# Patient Record
Sex: Female | Born: 1975
Health system: Southern US, Community
[De-identification: ages and names within clinical notes are randomized; demographics above are authoritative.]

## PROBLEM LIST (undated history)

## (undated) ENCOUNTER — Inpatient Hospital Stay (HOSPITAL_COMMUNITY): Payer: Self-pay

## (undated) DIAGNOSIS — F329 Major depressive disorder, single episode, unspecified: Secondary | ICD-10-CM

## (undated) DIAGNOSIS — F32A Depression, unspecified: Secondary | ICD-10-CM

## (undated) DIAGNOSIS — O09529 Supervision of elderly multigravida, unspecified trimester: Secondary | ICD-10-CM

## (undated) DIAGNOSIS — O24419 Gestational diabetes mellitus in pregnancy, unspecified control: Secondary | ICD-10-CM

## (undated) DIAGNOSIS — B019 Varicella without complication: Secondary | ICD-10-CM

## (undated) HISTORY — PX: NO PAST SURGERIES: SHX2092

---

## 2005-12-14 ENCOUNTER — Encounter: Admission: RE | Admit: 2005-12-14 | Discharge: 2005-12-14 | Payer: Self-pay | Admitting: Specialist

## 2010-12-15 ENCOUNTER — Emergency Department (HOSPITAL_BASED_OUTPATIENT_CLINIC_OR_DEPARTMENT_OTHER)
Admission: EM | Admit: 2010-12-15 | Discharge: 2010-12-15 | Disposition: A | Discharge status: Home / Self Care | Payer: Self-pay | Attending: Emergency Medicine | Admitting: Emergency Medicine

## 2010-12-15 ENCOUNTER — Emergency Department (INDEPENDENT_AMBULATORY_CARE_PROVIDER_SITE_OTHER): Payer: Self-pay

## 2010-12-15 DIAGNOSIS — R0602 Shortness of breath: Secondary | ICD-10-CM

## 2010-12-15 DIAGNOSIS — R0989 Other specified symptoms and signs involving the circulatory and respiratory systems: Secondary | ICD-10-CM | POA: Insufficient documentation

## 2010-12-15 DIAGNOSIS — R0609 Other forms of dyspnea: Secondary | ICD-10-CM | POA: Insufficient documentation

## 2010-12-15 LAB — POCT CARDIAC MARKERS

## 2010-12-15 LAB — COMPREHENSIVE METABOLIC PANEL
AST: 40 U/L — ABNORMAL HIGH (ref 0–37)
Albumin: 3.8 g/dL (ref 3.5–5.2)
BUN: 8 mg/dL (ref 6–23)
CO2: 26 mEq/L (ref 19–32)
Calcium: 8.8 mg/dL (ref 8.4–10.5)
Potassium: 4.6 mEq/L (ref 3.5–5.1)
Total Protein: 7.2 g/dL (ref 6.0–8.3)

## 2010-12-15 MED ORDER — IOHEXOL 350 MG/ML SOLN: 80.0000 mL | Freq: Once | INTRAVENOUS | Status: DC | PRN

## 2011-10-01 ENCOUNTER — Encounter (HOSPITAL_BASED_OUTPATIENT_CLINIC_OR_DEPARTMENT_OTHER): Payer: Self-pay | Admitting: *Deleted

## 2011-10-01 ENCOUNTER — Emergency Department (HOSPITAL_BASED_OUTPATIENT_CLINIC_OR_DEPARTMENT_OTHER)
Admission: EM | Admit: 2011-10-01 | Discharge: 2011-10-01 | Disposition: A | Discharge status: Home / Self Care | Payer: Medicaid Other | Attending: Emergency Medicine | Admitting: Emergency Medicine

## 2011-10-01 DIAGNOSIS — R52 Pain, unspecified: Secondary | ICD-10-CM

## 2011-10-01 DIAGNOSIS — O209 Hemorrhage in early pregnancy, unspecified: Secondary | ICD-10-CM

## 2011-10-01 DIAGNOSIS — O26859 Spotting complicating pregnancy, unspecified trimester: Secondary | ICD-10-CM | POA: Insufficient documentation

## 2011-10-01 DIAGNOSIS — R1013 Epigastric pain: Secondary | ICD-10-CM | POA: Insufficient documentation

## 2011-10-01 LAB — URINE MICROSCOPIC-ADD ON

## 2011-10-01 LAB — CBC
HCT: 35.3 % — ABNORMAL LOW (ref 36.0–46.0)
Hemoglobin: 12.1 g/dL (ref 12.0–15.0)
RBC: 4.05 MIL/uL (ref 3.87–5.11)
WBC: 10.4 10*3/uL (ref 4.0–10.5)

## 2011-10-01 LAB — URINALYSIS, ROUTINE W REFLEX MICROSCOPIC
Glucose, UA: NEGATIVE mg/dL
Nitrite: NEGATIVE
Protein, ur: NEGATIVE mg/dL
Urobilinogen, UA: 0.2 mg/dL (ref 0.0–1.0)

## 2011-10-01 LAB — PREGNANCY, URINE: Preg Test, Ur: POSITIVE — AB

## 2011-10-01 LAB — ABO/RH: ABO/RH(D): O POS

## 2011-10-01 LAB — BASIC METABOLIC PANEL: GFR calc Af Amer: >90 mL/min (ref >90)

## 2011-10-01 NOTE — ED Provider Notes (Signed)
Medical screening examination/treatment/procedure(s) were conducted as a shared visit with non-physician practitioner(s) and myself.  I personally evaluated the patient during the encounter   Dione Booze, MD 10/01/11 2356

## 2011-10-01 NOTE — ED Notes (Signed)
Patient denies pain and is resting comfortably.  

## 2011-10-01 NOTE — ED Notes (Signed)
[redacted] weeks pregnant. Abdominal cramps and small amt of vaginal bleeding last night and tonight.

## 2011-10-01 NOTE — ED Provider Notes (Signed)
History     CSN: [id number]  Arrival date & time 10/01/11  1911   First MD Initiated Contact with Patient 10/01/11 2047      Chief Complaint  Patient presents with  . Vaginal Bleeding    (Consider location/radiation/quality/duration/timing/severity/associated sxs/prior treatment) HPI Comments: Pt states that she thinks that she is [redacted] weeks pregnant:pt states her lmp was 12/10:pt states that [facility] and had a positive pregnancy:pt states that this is her 3rd pregnancy and she has 2 live children:pt states that she had a little bit of bleeding last night and again tonight:pt states that it is not even enough that she needs a pad or tampon:pt states that she is having some suprapubic cramping  Patient is a 36 y.o. female presenting with vaginal bleeding. The history is provided by the patient. No language interpreter was used.  Vaginal Bleeding This is a new problem. The current episode started yesterday. The problem occurs intermittently. The problem has been unchanged. Pertinent negatives include no coughing, fever, vomiting or weakness. The symptoms are aggravated by nothing. She has tried nothing for the symptoms.    History reviewed. No pertinent past medical history.  History reviewed. No pertinent past surgical history.  No family history on file.  History  Substance Use Topics  . Smoking status: Never Smoker   . Smokeless tobacco: Not on file  . Alcohol Use: No    OB History    Grav Para Term Preterm Abortions TAB SAB Ect Mult Living                  Review of Systems  Constitutional: Negative for fever.  Respiratory: Negative for cough.   Gastrointestinal: Negative for vomiting.  Genitourinary: Positive for vaginal bleeding.  Neurological: Negative for weakness.  All other systems reviewed and are negative.    Allergies  Review of patient's allergies indicates no known allergies.  Home Medications   Current Outpatient  Rx  Name Route Sig Dispense Refill  . CETIRIZINE HCL 10 MG PO TABS Oral Take 10 mg by mouth daily.    Marland Kitchen PRENATAL PLUS 27-1 MG PO TABS Oral Take 1 tablet by mouth daily.      BP 114/63  Pulse 81  Temp(Src) 98.7 F (37.1 C) (Oral)  Resp 20  SpO2 100%  LMP 08/10/2011  Physical Exam  Nursing note and vitals reviewed. Constitutional: She is oriented to person, place, and time. She appears well-developed and well-nourished.  HENT:  Head: Normocephalic and atraumatic.  Eyes: EOM are normal.  Neck: Neck supple.  Cardiovascular: Normal rate and regular rhythm.   Pulmonary/Chest: Effort normal and breath sounds normal.  Abdominal: Soft. Bowel sounds are normal. There is tenderness in the suprapubic area.  Genitourinary: Right adnexum displays no tenderness. Left adnexum displays no tenderness.    Musculoskeletal: Normal range of motion.  Neurological: She is alert and oriented to person, place, and time.  Skin: Skin is warm and dry.  Psychiatric: She has a normal mood and affect.    ED Course  Procedures (including critical care time)  Labs Reviewed  PREGNANCY, URINE - Abnormal; Notable for the following:    Preg Test, Ur POSITIVE (*)    All other components within normal limits  CBC - Abnormal; Notable for the following:    HCT 35.3 (*)    All other components within normal limits  BASIC METABOLIC PANEL  URINALYSIS, ROUTINE W REFLEX MICROSCOPIC  ABO/RH  HCG, QUANTITATIVE, PREGNANCY  No results found.   1. Bleeding in early pregnancy       MDM  Discussed with pt that possibility of miscarriage:pt is to come back tomorrow to have and ultrasound:pt vitals are stable        Teressa Lower, NP 10/01/11 2159

## 2011-10-01 NOTE — ED Provider Notes (Signed)
36 year old female is approximately [redacted] weeks pregnant had onset last night of vaginal spotting and suprapubic cramping. On exam, she is only mildly to moderately tender in the suprapubic area and in the midline. She will need to have a pelvic ultrasound set up as an outpatient tomorrow.  Dione Booze, MD 10/01/11 2145

## 2011-10-01 NOTE — ED Notes (Signed)
Pelvic cart at bedside of pt.

## 2011-10-01 NOTE — ED Notes (Signed)
Pt. Aware she is to return in the morning for [place]

## 2011-10-02 ENCOUNTER — Other Ambulatory Visit (HOSPITAL_BASED_OUTPATIENT_CLINIC_OR_DEPARTMENT_OTHER): Payer: Self-pay | Admitting: Nurse Practitioner

## 2011-10-02 ENCOUNTER — Ambulatory Visit (INDEPENDENT_AMBULATORY_CARE_PROVIDER_SITE_OTHER)
Admission: RE | Admit: 2011-10-02 | Discharge: 2011-10-02 | Disposition: A | Discharge status: Home / Self Care | Payer: Medicaid Other | Source: Ambulatory Visit | Attending: Nurse Practitioner | Admitting: Nurse Practitioner

## 2011-10-02 ENCOUNTER — Ambulatory Visit (HOSPITAL_BASED_OUTPATIENT_CLINIC_OR_DEPARTMENT_OTHER)
Admission: RE | Admit: 2011-10-02 | Discharge: 2011-10-02 | Disposition: A | Discharge status: Home / Self Care | Payer: Medicaid Other | Source: Ambulatory Visit | Attending: Nurse Practitioner | Admitting: Nurse Practitioner

## 2011-10-02 DIAGNOSIS — Z331 Pregnant state, incidental: Secondary | ICD-10-CM

## 2011-10-02 DIAGNOSIS — R52 Pain, unspecified: Secondary | ICD-10-CM

## 2011-10-02 DIAGNOSIS — N898 Other specified noninflammatory disorders of vagina: Secondary | ICD-10-CM

## 2011-10-02 DIAGNOSIS — R109 Unspecified abdominal pain: Secondary | ICD-10-CM | POA: Insufficient documentation

## 2011-10-02 DIAGNOSIS — R58 Hemorrhage, not elsewhere classified: Secondary | ICD-10-CM

## 2011-10-02 DIAGNOSIS — N949 Unspecified condition associated with female genital organs and menstrual cycle: Secondary | ICD-10-CM

## 2011-10-02 DIAGNOSIS — O99891 Other specified diseases and conditions complicating pregnancy: Secondary | ICD-10-CM | POA: Insufficient documentation

## 2011-11-12 ENCOUNTER — Other Ambulatory Visit: Payer: Self-pay | Admitting: Obstetrics and Gynecology

## 2011-11-12 ENCOUNTER — Other Ambulatory Visit (HOSPITAL_COMMUNITY)
Admission: RE | Admit: 2011-11-12 | Discharge: 2011-11-12 | Disposition: A | Discharge status: Home / Self Care | Payer: Medicaid Other | Source: Ambulatory Visit | Attending: Obstetrics and Gynecology | Admitting: Obstetrics and Gynecology

## 2011-11-12 DIAGNOSIS — Z113 Encounter for screening for infections with a predominantly sexual mode of transmission: Secondary | ICD-10-CM | POA: Insufficient documentation

## 2011-11-12 DIAGNOSIS — R87810 Cervical high risk human papillomavirus (HPV) DNA test positive: Secondary | ICD-10-CM | POA: Insufficient documentation

## 2011-11-12 DIAGNOSIS — Z124 Encounter for screening for malignant neoplasm of cervix: Secondary | ICD-10-CM | POA: Insufficient documentation

## 2011-11-17 ENCOUNTER — Encounter: Payer: Self-pay | Admitting: Obstetrics and Gynecology

## 2011-11-17 LAB — US OB COMP + 14 WK

## 2011-12-08 ENCOUNTER — Encounter (HOSPITAL_COMMUNITY): Payer: Self-pay | Admitting: Obstetrics and Gynecology

## 2011-12-08 ENCOUNTER — Other Ambulatory Visit: Payer: Self-pay | Admitting: Obstetrics and Gynecology

## 2011-12-08 DIAGNOSIS — O09529 Supervision of elderly multigravida, unspecified trimester: Secondary | ICD-10-CM

## 2011-12-16 ENCOUNTER — Encounter (HOSPITAL_COMMUNITY): Payer: Self-pay

## 2011-12-16 ENCOUNTER — Ambulatory Visit (HOSPITAL_COMMUNITY)
Admission: RE | Admit: 2011-12-16 | Discharge: 2011-12-16 | Disposition: A | Discharge status: Home / Self Care | Payer: Medicaid Other | Source: Ambulatory Visit | Attending: Obstetrics and Gynecology | Admitting: Obstetrics and Gynecology

## 2011-12-16 DIAGNOSIS — O09529 Supervision of elderly multigravida, unspecified trimester: Secondary | ICD-10-CM | POA: Insufficient documentation

## 2011-12-16 DIAGNOSIS — Z1389 Encounter for screening for other disorder: Secondary | ICD-10-CM | POA: Insufficient documentation

## 2011-12-16 DIAGNOSIS — O358XX0 Maternal care for other (suspected) fetal abnormality and damage, not applicable or unspecified: Secondary | ICD-10-CM | POA: Insufficient documentation

## 2011-12-16 DIAGNOSIS — Z363 Encounter for antenatal screening for malformations: Secondary | ICD-10-CM | POA: Insufficient documentation

## 2011-12-16 HISTORY — DX: Major depressive disorder, single episode, unspecified: F32.9

## 2011-12-16 HISTORY — DX: Varicella without complication: B01.9

## 2011-12-16 HISTORY — DX: Depression, unspecified: F32.A

## 2011-12-16 NOTE — Progress Notes (Signed)
Genetic Counseling  High-Risk Gestation Note  Appointment Date:  12/16/2011 Referred By: Fortino Sic, MD Date of Birth:  1976-05-08    Pregnancy History: [id number] Estimated Date of Delivery: 05/16/12 Estimated Gestational Age: [redacted]w[redacted]d Attending: Particia Nearing, MD   Stacy Cole was seen for genetic counseling because of a maternal age of 36 y.o..     She was counseled regarding maternal age and the association with risk for chromosome conditions due to nondisjunction with aging of the ova.   We reviewed chromosomes, nondisjunction, and the associated 1 in 15 risk for fetal aneuploidy related to a maternal age of 36 y.o. at [redacted]w[redacted]d gestation.  She was counseled that the risk for aneuploidy decreases as gestational age increases, accounting for those pregnancies which spontaneously abort.  We specifically discussed Down syndrome (trisomy 67), trisomies 15 and 58, and sex chromosome aneuploidies (47,XXX and 47,XXY) including the common features and prognoses of each.   We reviewed available screening and diagnostic options.  Regarding screening tests, we discussed the options of Quad screen and ultrasound.  She understands that screening tests are used to modify a patient's a priori risk for aneuploidy, typically based on age.  This estimate provides a pregnancy specific risk assessment. We discussed another type of screening test, noninvasive prenatal testing (NIPT), which utilizes cell free fetal DNA found in the maternal circulation. This test is not diagnostic for chromosome conditions, but can provide information regarding the presence or absence of extra fetal DNA for chromosomes 13, 18 and 21. Thus, it would not identify or rule out all fetal aneuploidy. The reported detection rate is greater than 99% for Trisomy 21, greater than 97% for Trisomy 18, and is approximately 80% (8 out of 10) for Trisomy 13. The false positive rate is reported to be less than 1% for any of these conditions. We  also reviewed the availability of diagnostic option amniocentesis.  We discussed the risks, limitations, and benefits of each.  After reviewing these options, Stacy Cole elected to have ultrasound only, but declined Quad screen, cell free fetal DNA testing, and amniocentesis. She understands that ultrasound cannot rule out all birth defects or genetic syndromes. The patient was advised of this limitation and states she still does not want diagnostic testing at this time.  However, she was counseled that 50-80% of fetuses with Down syndrome and up to 90% of fetuses with trisomies 13 and 18, when well visualized, have detectable anomalies or soft markers by ultrasound. Complete detailed ultrasound was performed at the time of today's visit. Ultrasound results reported separately.   Stacy Cole was provided with written information regarding sickle cell anemia (SCA) including the carrier frequency and incidence in the African-American population, the availability of carrier testing and prenatal diagnosis if indicated.  In addition, we discussed that hemoglobinopathies are routinely screened for as part of the [place] newborn screening panel.  She elected to proceed with hemoglobin electrophoresis today.  Both family histories were reviewed and found to be noncontributory for birth defects, mental retardation, and known genetic conditions. Without further information regarding the provided family history, an accurate genetic risk cannot be calculated. Further genetic counseling is warranted if more information is obtained.  [name] denied exposure to environmental toxins or chemical agents. She denied the use of alcohol, tobacco or street drugs. She denied significant viral illnesses during the course of her pregnancy. Her medical and surgical histories were noncontributory.   I counseled Stacy Cole regarding the above risks and  available options.  The approximate face-to-face time with the  genetic counselor was 40 minutes.  Quinn Plowman, MS,  Certified Genetic Counselor 12/16/2011

## 2011-12-18 LAB — HEMOGLOBINOPATHY EVALUATION
Hgb A2 Quant: 2.7 % (ref 2.2–3.2)
Hgb A: 97.3 % (ref 96.8–97.8)
Hgb S Quant: 0 %

## 2011-12-21 ENCOUNTER — Telehealth (HOSPITAL_COMMUNITY): Payer: Self-pay | Admitting: MS"

## 2011-12-21 NOTE — Telephone Encounter (Signed)
Left message for patient to return call.

## 2011-12-22 NOTE — Telephone Encounter (Signed)
Called Ms. Leolia Heffington to discuss hemoglobin electrophoresis indicated the presence of normal hemoglobin (Hb AA). Thus, she does not appear to have sickle cell trait or other hemoglobin variant. Patient was happy to hear this information. All questions were answered to her satisfaction.   Quinn Plowman, MS Certified Genetic Counselor 12/22/2011  10:52 AM

## 2011-12-24 LAB — OB RESULTS CONSOLE ANTIBODY SCREEN: Antibody Screen: NEGATIVE

## 2011-12-24 LAB — OB RESULTS CONSOLE HEPATITIS B SURFACE ANTIGEN: Hepatitis B Surface Ag: NEGATIVE

## 2011-12-24 LAB — OB RESULTS CONSOLE ABO/RH

## 2011-12-24 LAB — OB RESULTS CONSOLE GC/CHLAMYDIA: Gonorrhea: NEGATIVE

## 2011-12-24 LAB — OB RESULTS CONSOLE RUBELLA ANTIBODY, IGM: Rubella: IMMUNE

## 2011-12-24 LAB — OB RESULTS CONSOLE HIV ANTIBODY (ROUTINE TESTING): HIV: NONREACTIVE

## 2012-02-16 ENCOUNTER — Other Ambulatory Visit: Payer: Self-pay

## 2012-03-10 ENCOUNTER — Encounter: Payer: Medicaid Other | Attending: Obstetrics & Gynecology | Admitting: Dietician

## 2012-03-10 ENCOUNTER — Ambulatory Visit (HOSPITAL_COMMUNITY)
Admission: RE | Admit: 2012-03-10 | Discharge: 2012-03-10 | Disposition: A | Discharge status: Home / Self Care | Payer: Medicaid Other | Source: Ambulatory Visit | Attending: Obstetrics & Gynecology | Admitting: Obstetrics & Gynecology

## 2012-03-10 DIAGNOSIS — Z713 Dietary counseling and surveillance: Secondary | ICD-10-CM | POA: Insufficient documentation

## 2012-03-10 DIAGNOSIS — O99810 Abnormal glucose complicating pregnancy: Secondary | ICD-10-CM | POA: Insufficient documentation

## 2012-03-10 NOTE — ED Notes (Signed)
03/10/2012 Diabetes Education:  G2 P3 lady currently at 30 weeks with an EDD of 05/16/2012.  Reports no problems other than GDM.  Three OGGT: Fasting 106, 1 Hr=209 2 hr= 198 3 hr= 198.  MD has prescribed a meter and she is monitoring fasting and 1 hr post meal blood glucose levels.  Fastings= 86-111. 1 hr post Breakfast=102-176  2 hr post lunch=68-110 1hr post dinner= 106,157.  She has been started on Glyburide 2.5 mg 2 tablets before breakfast and 2 tablets at bedtime.  Today, we reviewed the Carb restricted diet for GDM and Carbohydrate counting.  Provided handouts:Nutrition, Diabetes, and Pregnancy, and the Carb Counting Guide.  She has my card and is to call or e-mail with questions.  Stacy Angelyne Terwilliger, RN, RD, CDE

## 2012-03-17 ENCOUNTER — Other Ambulatory Visit: Payer: Self-pay

## 2012-04-01 ENCOUNTER — Ambulatory Visit (HOSPITAL_COMMUNITY)
Admission: RE | Admit: 2012-04-01 | Discharge: 2012-04-01 | Disposition: A | Discharge status: Home / Self Care | Payer: Medicaid Other | Source: Ambulatory Visit | Attending: Obstetrics & Gynecology | Admitting: Obstetrics & Gynecology

## 2012-04-01 ENCOUNTER — Encounter (HOSPITAL_COMMUNITY): Payer: Self-pay

## 2012-04-01 VITALS — BP 118/69 | HR 108 | Wt 218.8 lb

## 2012-04-01 DIAGNOSIS — O09529 Supervision of elderly multigravida, unspecified trimester: Secondary | ICD-10-CM

## 2012-04-01 HISTORY — DX: Gestational diabetes mellitus in pregnancy, unspecified control: O24.419

## 2012-04-01 HISTORY — DX: Supervision of elderly multigravida, unspecified trimester: O09.529

## 2012-04-11 ENCOUNTER — Other Ambulatory Visit: Payer: Self-pay | Admitting: Obstetrics and Gynecology

## 2012-04-11 ENCOUNTER — Other Ambulatory Visit (HOSPITAL_COMMUNITY)
Admission: RE | Admit: 2012-04-11 | Discharge: 2012-04-11 | Disposition: A | Discharge status: Home / Self Care | Payer: Medicaid Other | Source: Ambulatory Visit | Attending: Obstetrics and Gynecology | Admitting: Obstetrics and Gynecology

## 2012-04-11 DIAGNOSIS — Z124 Encounter for screening for malignant neoplasm of cervix: Secondary | ICD-10-CM | POA: Insufficient documentation

## 2012-04-14 NOTE — Progress Notes (Signed)
MFM Note  [name] is a 36 year old G3P2 Haitian at 33+4 weeks who presents for consultation regarding the management of her gestational diabetes. [name] had a one hour glucola of 178 follow by at 3 hour OGTT and the results were 106,209,198 and 198. Her Hgb A1c was 7.1. She received diabetic teaching and began testing her blood sugar at home. After about one week, she was started on glyburide and the dose has been increased slowly to 7.5 mgs in AM and 5.0 mgs in PM with good results. Reviewing her last ~ 2 weeks of blood sugars, only a very few were above the desired targets. She denies any other prenatal complications.  OB history: G1: 1999; term vaginal delivery in [place]; no complications G2: 2005; term vaginal delivery in [place], [place]; no complications G3: current pregnancy Medical history: none other than GDM Surgical history: none Allergies: NKDA Medications: PNV, iron and glyburide (as above) Social history: works at home; no tob/ETOH/drugs Family history: no congenital defects and no inheritable disorders  She understands the goals of therapy include fasting blood sugars less than 90 mg/dl and 1-hour postprandial less than 140 mg/dl with avoidance of hypoglycemia. I feel that right now her sugars are well controlled and you can continue to increase the glyburide if needed. If the maximum daily dose of glyburide is reached (10 mg bid) and glycemic control remains inadequate, the patient should be stared on insulin.    We reviewed that the goal of good glycemic control is to prevent macrosomia and the subsequent complications such as birth trauma, increased need for C/S and metabolic disturbances in the neonate.  Assessment: 1) IUP at 33+4 weeks 2) GDM requiring medication 3) AMA  Recommendations: 1) Continue to increase glyburide to 10 mgs every 12 hours 2) Needs twice weekly NSTs with weekly AFIs/BPPs 3) Growth [place] if not one in third trimester 4) Delivery by 39  weeks  Please call us if you have maxed out the glyburide and we can help with the initiation of insulin. Thank you for the kind referral.  (Face-to-face consultation with patient: 30 min)

## 2012-04-22 ENCOUNTER — Encounter (HOSPITAL_COMMUNITY): Payer: Self-pay | Admitting: *Deleted

## 2012-04-22 ENCOUNTER — Telehealth (HOSPITAL_COMMUNITY): Payer: Self-pay | Admitting: *Deleted

## 2012-04-22 NOTE — Telephone Encounter (Signed)
Preadmission screen  

## 2012-04-26 ENCOUNTER — Observation Stay (HOSPITAL_COMMUNITY)
Admission: RE | Admit: 2012-04-26 | Discharge: 2012-04-26 | Disposition: A | Discharge status: Home / Self Care | Payer: Medicaid Other | Source: Ambulatory Visit | Attending: Obstetrics & Gynecology | Admitting: Obstetrics & Gynecology

## 2012-04-26 ENCOUNTER — Encounter (HOSPITAL_COMMUNITY): Payer: Self-pay

## 2012-04-26 DIAGNOSIS — O321XX0 Maternal care for breech presentation, not applicable or unspecified: Secondary | ICD-10-CM | POA: Insufficient documentation

## 2012-04-26 DIAGNOSIS — O09529 Supervision of elderly multigravida, unspecified trimester: Secondary | ICD-10-CM | POA: Insufficient documentation

## 2012-04-26 DIAGNOSIS — O99810 Abnormal glucose complicating pregnancy: Principal | ICD-10-CM | POA: Insufficient documentation

## 2012-05-04 NOTE — H&P (Signed)
  Pt presented for external cephalic version secondary to breech presentation, but the baby had flipped to vertex spontaneously and the procedure had no indication.   A bedside US was done to confirm this and the patient recalls the baby moving to vertex the previous night

## 2012-05-06 ENCOUNTER — Telehealth (HOSPITAL_COMMUNITY): Payer: Self-pay | Admitting: *Deleted

## 2012-05-06 NOTE — Telephone Encounter (Signed)
Preadmission screen  

## 2012-05-08 ENCOUNTER — Encounter (HOSPITAL_COMMUNITY): Payer: Self-pay | Admitting: Anesthesiology

## 2012-05-08 ENCOUNTER — Encounter (HOSPITAL_COMMUNITY): Payer: Self-pay

## 2012-05-08 ENCOUNTER — Inpatient Hospital Stay (HOSPITAL_COMMUNITY)
Admission: RE | Admit: 2012-05-08 | Discharge: 2012-05-10 | DRG: 774 | Disposition: A | Discharge status: Home / Self Care | Payer: Medicaid Other | Source: Ambulatory Visit | Attending: Obstetrics & Gynecology | Admitting: Obstetrics & Gynecology

## 2012-05-08 ENCOUNTER — Inpatient Hospital Stay (HOSPITAL_COMMUNITY): Payer: Medicaid Other | Admitting: Anesthesiology

## 2012-05-08 VITALS — BP 112/76 | HR 94 | Temp 98.0°F | Resp 18 | Ht 67.0 in | Wt 222.0 lb

## 2012-05-08 DIAGNOSIS — O368190 Decreased fetal movements, unspecified trimester, not applicable or unspecified: Secondary | ICD-10-CM | POA: Diagnosis present

## 2012-05-08 DIAGNOSIS — O99814 Abnormal glucose complicating childbirth: Secondary | ICD-10-CM | POA: Diagnosis present

## 2012-05-08 DIAGNOSIS — O09529 Supervision of elderly multigravida, unspecified trimester: Secondary | ICD-10-CM | POA: Diagnosis present

## 2012-05-08 DIAGNOSIS — O4690 Antepartum hemorrhage, unspecified, unspecified trimester: Secondary | ICD-10-CM | POA: Diagnosis present

## 2012-05-08 DIAGNOSIS — O3660X0 Maternal care for excessive fetal growth, unspecified trimester, not applicable or unspecified: Principal | ICD-10-CM | POA: Diagnosis present

## 2012-05-08 LAB — COMPREHENSIVE METABOLIC PANEL
ALT: 7 U/L (ref 0–35)
AST: 23 U/L (ref 0–37)
Alkaline Phosphatase: 199 U/L — ABNORMAL HIGH (ref 39–117)
CO2: 21 mEq/L (ref 19–32)
Calcium: 8.9 mg/dL (ref 8.4–10.5)
GFR calc Af Amer: >90 mL/min (ref >90)
GFR calc non Af Amer: >90 mL/min (ref >90)
Glucose, Bld: 92 mg/dL (ref 70–99)
Potassium: 3.8 mEq/L (ref 3.5–5.1)
Sodium: 134 mEq/L — ABNORMAL LOW (ref 135–145)
Total Protein: 5.8 g/dL — ABNORMAL LOW (ref 6.0–8.3)

## 2012-05-08 LAB — RPR: RPR Ser Ql: NONREACTIVE

## 2012-05-08 LAB — CBC
HCT: 28.9 % — ABNORMAL LOW (ref 36.0–46.0)
Hemoglobin: 9.8 g/dL — ABNORMAL LOW (ref 12.0–15.0)
MCHC: 33.9 g/dL (ref 30.0–36.0)
WBC: 5.1 10*3/uL (ref 4.0–10.5)

## 2012-05-08 LAB — PREPARE RBC (CROSSMATCH)

## 2012-05-08 LAB — GLUCOSE, CAPILLARY: Glucose-Capillary: 72 mg/dL (ref 70–99)

## 2012-05-08 MED ORDER — OXYTOCIN 40 UNITS IN LACTATED RINGERS INFUSION - SIMPLE MED
1.0000 m[IU]/min | INTRAVENOUS | Status: DC
  Administered 2012-05-08: 2 m[IU]/min via INTRAVENOUS
  Filled 2012-05-08: qty 1000

## 2012-05-08 MED ORDER — PHENYLEPHRINE 40 MCG/ML (10ML) SYRINGE FOR IV PUSH (FOR BLOOD PRESSURE SUPPORT)
80.0000 ug | PREFILLED_SYRINGE | INTRAVENOUS | Status: DC | PRN
  Filled 2012-05-08: qty 5

## 2012-05-08 MED ORDER — OXYTOCIN 40 UNITS IN LACTATED RINGERS INFUSION - SIMPLE MED
62.5000 mL/h | Freq: Once | INTRAVENOUS | Status: AC
  Administered 2012-05-09: 62.5 mL/h via INTRAVENOUS

## 2012-05-08 MED ORDER — PENICILLIN G POTASSIUM 5000000 UNITS IJ SOLR
2.5000 10*6.[IU] | INTRAVENOUS | Status: DC
  Administered 2012-05-08 (×2): 2.5 10*6.[IU] via INTRAVENOUS
  Filled 2012-05-08 (×6): qty 2.5

## 2012-05-08 MED ORDER — LACTATED RINGERS IV SOLN
INTRAVENOUS | Status: DC
  Administered 2012-05-08: 20:00:00 via INTRAVENOUS
  Administered 2012-05-08: 125 mL/h via INTRAVENOUS

## 2012-05-08 MED ORDER — BUTORPHANOL TARTRATE 1 MG/ML IJ SOLN
1.0000 mg | INTRAMUSCULAR | Status: DC | PRN
  Administered 2012-05-08 (×2): 1 mg via INTRAVENOUS
  Filled 2012-05-08 (×2): qty 1

## 2012-05-08 MED ORDER — DEXTROSE 5 % IV SOLN
5.0000 10*6.[IU] | Freq: Once | INTRAVENOUS | Status: AC
  Administered 2012-05-08: 5 10*6.[IU] via INTRAVENOUS
  Filled 2012-05-08: qty 5

## 2012-05-08 MED ORDER — LIDOCAINE HCL (PF) 1 % IJ SOLN
30.0000 mL | INTRAMUSCULAR | Status: DC | PRN
  Filled 2012-05-08: qty 30

## 2012-05-08 MED ORDER — LIDOCAINE HCL (PF) 1 % IJ SOLN
INTRAMUSCULAR | Status: DC | PRN
  Administered 2012-05-08 (×4): 4 mL

## 2012-05-08 MED ORDER — IBUPROFEN 600 MG PO TABS
600.0000 mg | ORAL_TABLET | Freq: Four times a day (QID) | ORAL | Status: DC | PRN
  Administered 2012-05-09: 600 mg via ORAL
  Filled 2012-05-08: qty 1

## 2012-05-08 MED ORDER — FLEET ENEMA 7-19 GM/118ML RE ENEM: 1.0000 | ENEMA | RECTAL | Status: DC | PRN

## 2012-05-08 MED ORDER — FENTANYL 2.5 MCG/ML BUPIVACAINE 1/10 % EPIDURAL INFUSION (WH - ANES)
14.0000 mL/h | INTRAMUSCULAR | Status: DC
  Administered 2012-05-08 (×2): 14 mL/h via EPIDURAL
  Filled 2012-05-08 (×2): qty 60

## 2012-05-08 MED ORDER — PENICILLIN G POTASSIUM 5000000 UNITS IJ SOLR
2.5000 10*6.[IU] | INTRAVENOUS | Status: DC
  Filled 2012-05-08 (×3): qty 2.5

## 2012-05-08 MED ORDER — EPHEDRINE 5 MG/ML INJ: 10.0000 mg | INTRAVENOUS | Status: DC | PRN

## 2012-05-08 MED ORDER — DIPHENHYDRAMINE HCL 50 MG/ML IJ SOLN: 12.5000 mg | INTRAMUSCULAR | Status: DC | PRN

## 2012-05-08 MED ORDER — PHENYLEPHRINE 40 MCG/ML (10ML) SYRINGE FOR IV PUSH (FOR BLOOD PRESSURE SUPPORT): 80.0000 ug | PREFILLED_SYRINGE | INTRAVENOUS | Status: DC | PRN

## 2012-05-08 MED ORDER — ACETAMINOPHEN 325 MG PO TABS: 650.0000 mg | ORAL_TABLET | ORAL | Status: DC | PRN

## 2012-05-08 MED ORDER — HYDROXYZINE HCL 50 MG PO TABS: 50.0000 mg | ORAL_TABLET | Freq: Four times a day (QID) | ORAL | Status: DC | PRN

## 2012-05-08 MED ORDER — PENICILLIN G POTASSIUM 5000000 UNITS IJ SOLR
5.0000 10*6.[IU] | Freq: Once | INTRAVENOUS | Status: DC
  Filled 2012-05-08: qty 5

## 2012-05-08 MED ORDER — LACTATED RINGERS IV SOLN: 500.0000 mL | INTRAVENOUS | Status: DC | PRN

## 2012-05-08 MED ORDER — EPHEDRINE 5 MG/ML INJ
10.0000 mg | INTRAVENOUS | Status: DC | PRN
  Filled 2012-05-08: qty 4

## 2012-05-08 MED ORDER — HYDROXYZINE HCL 50 MG/ML IM SOLN: 50.0000 mg | Freq: Four times a day (QID) | INTRAMUSCULAR | Status: DC | PRN

## 2012-05-08 MED ORDER — MISOPROSTOL 25 MCG QUARTER TABLET
25.0000 ug | ORAL_TABLET | ORAL | Status: DC | PRN
  Administered 2012-05-08: 25 ug via VAGINAL
  Filled 2012-05-08: qty 0.25

## 2012-05-08 MED ORDER — OXYTOCIN BOLUS FROM INFUSION
500.0000 mL | Freq: Once | INTRAVENOUS | Status: DC
  Filled 2012-05-08: qty 500

## 2012-05-08 MED ORDER — LACTATED RINGERS IV SOLN: 500.0000 mL | Freq: Once | INTRAVENOUS | Status: DC

## 2012-05-08 MED ORDER — CITRIC ACID-SODIUM CITRATE 334-500 MG/5ML PO SOLN: 30.0000 mL | ORAL | Status: DC | PRN

## 2012-05-08 MED ORDER — ONDANSETRON HCL 4 MG/2ML IJ SOLN: 4.0000 mg | Freq: Four times a day (QID) | INTRAMUSCULAR | Status: DC | PRN

## 2012-05-08 MED ORDER — TERBUTALINE SULFATE 1 MG/ML IJ SOLN: 0.2500 mg | Freq: Once | INTRAMUSCULAR | Status: AC | PRN

## 2012-05-08 MED ORDER — OXYCODONE-ACETAMINOPHEN 5-325 MG PO TABS: 1.0000 | ORAL_TABLET | ORAL | Status: DC | PRN

## 2012-05-08 NOTE — H&P (Signed)
Stacy Cole is a 36 y.o. female presenting 38w6 today for cervical ripening prior to induction secondary to presumed macrosomia as the last [place] was 9#5 and AMA and decreased fetal movement and the patient thought she was supposed to be here at 7 am and not 7 pm and in light of the miscommunication will proceed with ripening secondary to only the few hour time difference.   The patient is aware of the possibiltiy of elective CSx but would like to not have this .  She is also aware of the possibility the need for CSx or the need for forceps or the need for vacuum and this is secondary to the clinical situation.  She and spouse are aware of the potential for bleeding, infection, nerve damage or cephalohematoma  The pregnancy has been complicated by poorly controlled DM which has been well controlled lately on the Glyburide 7.5 mg in the am and 5 mg in the pm and her current CBG is 86.  The patient has been monitored with serial antenatal testing and this is how the macrosomia was discovered.  The patient was admitted with 3rd trimester bleeding and this resolved.  Admitted at [facility].  The patient is AMA and was seen by MFM here at [facility] on outpatient consult basis with normal findings except:  Succenturiate lobe    Maternal Medical History:  Fetal activity: Perceived fetal activity is normal.   Last perceived fetal movement was within the past hour.    Prenatal complications: Bleeding and placental abnormality.   No cholelithiasis, HIV, hypertension, infection, IUGR, nephrolithiasis, oligohydramnios, pre-eclampsia or preterm labor.   Class B DM  Prenatal Complications - Diabetes: gestational. Diabetes is managed by oral agent (monotherapy).      OB History    Grav Para Term Preterm Abortions TAB SAB Ect Mult Living   3 2 2  0 0 0 0 0 0 2     Past Medical History  Diagnosis Date  . Depression   . Chicken pox   . AMA (advanced maternal age) multigravida 35+   .  Gestational diabetes     glyburide   History reviewed. No pertinent past surgical history. Family History: family history is not on file. Social History:  reports that she has never smoked. She has never used smokeless tobacco. She reports that she does not drink alcohol or use illicit drugs.   Prenatal Transfer Tool  Maternal Diabetes: Yes:  Diabetes Type:  Insulin/Medication controlled Genetic Screening: Normal Maternal Ultrasounds/Referrals: Normal Fetal Ultrasounds or other Referrals:  None Maternal Substance Abuse:  No Significant Maternal Medications:  Meds include: Other: glyburide 7.5 mg q am and 5 mg before dinner Significant Maternal Lab Results:  Lab values include: Group B Strep positive, Other:  HgbA1c last 6.4 on 04-29-12 Other Comments:  None  Review of Systems  Constitutional: Negative.  Negative for fever and chills.  HENT: Negative.   Eyes: Negative.   Respiratory: Negative.  Negative for shortness of breath.   Gastrointestinal: Negative.   Genitourinary: Negative.   Musculoskeletal: Negative.  Negative for joint pain.  Skin: Negative.  Negative for rash.  Neurological: Negative.  Negative for headaches.  Endo/Heme/Allergies: Negative.   Psychiatric/Behavioral: Negative.  Negative for depression and suicidal ideas.      Blood pressure 125/68, pulse 93, temperature 98.1 F (36.7 C), temperature source Oral, resp. rate 20, height 5\' 7"  (1.702 m), weight 100.699 kg (222 lb), last menstrual period 08/10/2011. Maternal Exam:  Uterine Assessment: Contraction strength  is mild.  Contraction frequency is irregular.   Abdomen: Fundal height is term.   Estimated fetal weight is 9#0.   Fetal presentation: vertex  Introitus: Normal vulva. Normal vagina.  Ferning test: not done.  Nitrazine test: not done. Amniotic fluid character: not assessed.  Pelvis: adequate for delivery.   Cervix: Cervix evaluated by digital exam.     Physical Exam  Constitutional: She is  oriented to person, place, and time. She appears well-developed and well-nourished.  HENT:  Head: Normocephalic and atraumatic.  Eyes: Conjunctivae are normal. Pupils are equal, round, and reactive to light. Right eye exhibits no discharge. Left eye exhibits no discharge.  Neck: Normal range of motion. Neck supple. No tracheal deviation present. No thyromegaly present.  Cardiovascular: Normal rate and regular rhythm.   Respiratory: Effort normal and breath sounds normal.  GI: Soft. She exhibits no distension. There is no tenderness. There is no rebound and no guarding.  Genitourinary: Vagina normal and uterus normal.  Musculoskeletal: Normal range of motion. She exhibits no edema and no tenderness.  Neurological: She is alert and oriented to person, place, and time. She has normal reflexes.  Skin: Skin is warm and dry. No rash noted.  Psychiatric: She has a normal mood and affect. Her behavior is normal. Judgment and thought content normal.    Prenatal labs: ABO, Rh: O/Positive/-- (04/25 0000) Antibody: Negative (04/25 0000) Rubella: Immune (04/25 0000) RPR: Nonreactive (04/25 0000)  HBsAg: Negative (04/25 0000)  HIV: Non-reactive (04/25 0000)  GBS:   positive  Assessment/Plan: IUP 38w6  Macrosomia Class A2 DM with improved control as the pregnancy progressed AMA with normal US findings Succenturiate lobe GBS positive   cytotec Stacy Cole H. 05/08/2012, 8:47 AM

## 2012-05-08 NOTE — Progress Notes (Signed)
Stacy Cole is a 36 y.o. G3P2002 at [redacted]w[redacted]d by LMP admitted for induction of labor due to class A2 DM and macrosomia here for cervical ripening and now 3/70/-2-soft/anterior.    Subjective:  Starting to feel contraction last CBG 76 and  Pain control planned to be breathing and natural as with her last two - options were discussed Objective: BP 140/86  Pulse 86  Temp 98.5 F (36.9 C) (Oral)  Resp 20  Ht 5\' 7"  (1.702 m)  Wt 100.699 kg (222 lb)  BMI 34.77 kg/m2  LMP 08/10/2011      FHT:  FHR: 140s bpm, variability: moderate,  accelerations:  Present,  decelerations:  Absent UC:   irregular, every 3-8 minutes SVE:   Dilation: 3 Effacement (%): 70 Station: -2 Exam by:: Dr Christell Constant Amniotomy performed with clear fluid noted Labs: Lab Results  Component Value Date   WBC 5.1 05/08/2012   HGB 9.8* 05/08/2012   HCT 28.9* 05/08/2012   MCV 90.9 05/08/2012   PLT 208 05/08/2012    Assessment / Plan: Induction of labor due to gestational diabetes and macrosomia and h/o 3rd trimester bleeding,  progressing well on pitocin  Labor: will monitor for now as cervix favorable Preeclampsia:  na Fetal Wellbeing:  Category I Pain Control:  Labor support without medications I/D:  GBS pos and will start ampicillin Anticipated MOD:  NSVD  Courtne Lighty H. 05/08/2012, 2:11 PM

## 2012-05-08 NOTE — Progress Notes (Signed)
Pt placed on beacon because of difficulty tracing fhr

## 2012-05-08 NOTE — Progress Notes (Signed)
Stacy Cole is a 35 y.o. G3P2002 at [redacted]w[redacted]d by LMP admitted for induction of labor due to Diabetes and macrosomia and AMA and 3 rd trimester bleeding.  Subjective:  Now comfortable with the CLE Objective: BP 123/91  Pulse 101  Temp 98.8 F (37.1 C) (Oral)  Resp 20  Ht 5\' 7"  (1.702 m)  Wt 100.699 kg (222 lb)  BMI 34.77 kg/m2  LMP 08/10/2011      FHT:  FHR: 140s  bpm, variability: moderate,  accelerations:  Present,  decelerations:  Present variables not repetitive UC:   irregular, every 2-4 minutes, regular, every 2-4 minutes pitocin at 10 mIu SVE:   Dilation: 4 Effacement (%): 60 Station: -3 Exam by:: [email or web address]-[email or web address],rnc 5/90/0 per my exam now no caput or molding  Labs: Lab Results  Component Value Date   WBC 5.1 05/08/2012   HGB 9.8* 05/08/2012   HCT 28.9* 05/08/2012   MCV 90.9 05/08/2012   PLT 208 05/08/2012    Assessment / Plan: Induction of labor due to gestational diabetes and macrosomia and AMA and 3rd trimester bleeding,  progressing well on pitocin  Labor: entering active phase hopefully  Preeclampsia:  na Fetal Wellbeing:  Category II Pain Control:  Epidural I/D:  gbs pos on pcn Anticipated MOD:  NSVD  Stacy Breshears H. 05/08/2012, 8:22 PM

## 2012-05-08 NOTE — Progress Notes (Signed)
Reviewed options for pain medication with pt.  IV and epidural.  Pt knows to let RN know if she wants pain medication

## 2012-05-08 NOTE — Anesthesia Procedure Notes (Signed)
Epidural Patient location during procedure: OB Start time: 05/08/2012 7:49 PM  Staffing Performed by: anesthesiologist   Preanesthetic Checklist Completed: patient identified, site marked, surgical consent, pre-op evaluation, timeout performed, IV checked, risks and benefits discussed and monitors and equipment checked  Epidural Patient position: sitting Prep: site prepped and draped and DuraPrep Patient monitoring: continuous pulse ox and blood pressure Approach: midline Injection technique: LOR air  Needle:  Needle type: Tuohy  Needle gauge: 17 G Needle length: 9 cm and 9 Needle insertion depth: 6 cm Catheter type: closed end flexible Catheter size: 19 Gauge Catheter at skin depth: 11 cm Test dose: negative  Assessment Events: blood not aspirated, injection not painful, no injection resistance, negative IV test and no paresthesia  Additional Notes Discussed risk of headache, infection, bleeding, nerve injury and failed or incomplete block.  Patient voices understanding and wishes to proceed. Reason for block:procedure for pain

## 2012-05-08 NOTE — Anesthesia Preprocedure Evaluation (Signed)
Anesthesia Evaluation  Patient identified by MRN, date of birth, ID band Patient awake    Reviewed: Allergy & Precautions, H&P , NPO status , Patient's Chart, lab work & pertinent test results, reviewed documented beta blocker date and time   History of Anesthesia Complications Negative for: history of anesthetic complications  Airway Mallampati: III TM Distance: >3 FB Neck ROM: full    Dental  (+) Teeth Intact   Pulmonary neg pulmonary ROS,  breath sounds clear to auscultation        Cardiovascular negative cardio ROS  Rhythm:regular Rate:Normal     Neuro/Psych negative neurological ROS  negative psych ROS   GI/Hepatic negative GI ROS, Neg liver ROS,   Endo/Other  diabetes, Gestational, Oral Hypoglycemic Agents  Renal/GU negative Renal ROS     Musculoskeletal   Abdominal   Peds  Hematology negative hematology ROS (+)   Anesthesia Other Findings   Reproductive/Obstetrics (+) Pregnancy                           Anesthesia Physical Anesthesia Plan  ASA: III  Anesthesia Plan: Epidural   Post-op Pain Management:    Induction:   Airway Management Planned:   Additional Equipment:   Intra-op Plan:   Post-operative Plan:   Informed Consent: I have reviewed the patients History and Physical, chart, labs and discussed the procedure including the risks, benefits and alternatives for the proposed anesthesia with the patient or authorized representative who has indicated his/her understanding and acceptance.     Plan Discussed with:   Anesthesia Plan Comments:         Anesthesia Quick Evaluation

## 2012-05-08 NOTE — Progress Notes (Signed)
Late entry S. Starting to feel contractions O. vss af  FHR reactive CAT I Contraction irregular 2-6 with irritability SVE deferred \ A. Term IUP with macrosomia P continue with IOL and expectant management  Forestine Chute

## 2012-05-09 ENCOUNTER — Encounter (HOSPITAL_COMMUNITY): Payer: Self-pay

## 2012-05-09 LAB — CBC
HCT: 28.2 % — ABNORMAL LOW (ref 36.0–46.0)
Hemoglobin: 9.4 g/dL — ABNORMAL LOW (ref 12.0–15.0)
MCHC: 33.3 g/dL (ref 30.0–36.0)
RBC: 3.12 MIL/uL — ABNORMAL LOW (ref 3.87–5.11)
WBC: 17.5 10*3/uL — ABNORMAL HIGH (ref 4.0–10.5)

## 2012-05-09 MED ORDER — IBUPROFEN 600 MG PO TABS
600.0000 mg | ORAL_TABLET | Freq: Four times a day (QID) | ORAL | Status: DC
  Administered 2012-05-09 – 2012-05-10 (×6): 600 mg via ORAL
  Filled 2012-05-09 (×6): qty 1

## 2012-05-09 MED ORDER — BENZOCAINE-MENTHOL 20-0.5 % EX AERO
1.0000 "application " | INHALATION_SPRAY | CUTANEOUS | Status: DC | PRN
  Administered 2012-05-09: 1 via TOPICAL
  Filled 2012-05-09: qty 56

## 2012-05-09 MED ORDER — SIMETHICONE 80 MG PO CHEW: 80.0000 mg | CHEWABLE_TABLET | ORAL | Status: DC | PRN

## 2012-05-09 MED ORDER — ONDANSETRON HCL 4 MG/2ML IJ SOLN: 4.0000 mg | INTRAMUSCULAR | Status: DC | PRN

## 2012-05-09 MED ORDER — OXYCODONE-ACETAMINOPHEN 5-325 MG PO TABS
1.0000 | ORAL_TABLET | ORAL | Status: DC | PRN
  Administered 2012-05-09 (×2): 1 via ORAL
  Administered 2012-05-09 – 2012-05-10 (×2): 2 via ORAL
  Administered 2012-05-10 (×2): 1 via ORAL
  Filled 2012-05-09 (×2): qty 2
  Filled 2012-05-09: qty 1
  Filled 2012-05-09: qty 2
  Filled 2012-05-09 (×2): qty 1

## 2012-05-09 MED ORDER — DIPHENHYDRAMINE HCL 25 MG PO CAPS: 25.0000 mg | ORAL_CAPSULE | Freq: Four times a day (QID) | ORAL | Status: DC | PRN

## 2012-05-09 MED ORDER — WITCH HAZEL-GLYCERIN EX PADS: 1.0000 "application " | MEDICATED_PAD | CUTANEOUS | Status: DC | PRN

## 2012-05-09 MED ORDER — TETANUS-DIPHTH-ACELL PERTUSSIS 5-2.5-18.5 LF-MCG/0.5 IM SUSP
0.5000 mL | Freq: Once | INTRAMUSCULAR | Status: AC
  Administered 2012-05-10: 0.5 mL via INTRAMUSCULAR

## 2012-05-09 MED ORDER — FERROUS SULFATE 325 (65 FE) MG PO TABS
325.0000 mg | ORAL_TABLET | Freq: Two times a day (BID) | ORAL | Status: DC
  Administered 2012-05-09 – 2012-05-10 (×3): 325 mg via ORAL
  Filled 2012-05-09 (×3): qty 1

## 2012-05-09 MED ORDER — PRENATAL MULTIVITAMIN CH
1.0000 | ORAL_TABLET | Freq: Every day | ORAL | Status: DC
Start: 2012-05-09 — End: 2012-05-10
  Administered 2012-05-09 – 2012-05-10 (×2): 1 via ORAL
  Filled 2012-05-09 (×2): qty 1

## 2012-05-09 MED ORDER — ZOLPIDEM TARTRATE 5 MG PO TABS: 5.0000 mg | ORAL_TABLET | Freq: Every evening | ORAL | Status: DC | PRN

## 2012-05-09 MED ORDER — LANOLIN HYDROUS EX OINT: TOPICAL_OINTMENT | CUTANEOUS | Status: DC | PRN

## 2012-05-09 MED ORDER — ONDANSETRON HCL 4 MG PO TABS: 4.0000 mg | ORAL_TABLET | ORAL | Status: DC | PRN

## 2012-05-09 MED ORDER — SENNOSIDES-DOCUSATE SODIUM 8.6-50 MG PO TABS
2.0000 | ORAL_TABLET | Freq: Every day | ORAL | Status: DC
  Administered 2012-05-09: 2 via ORAL

## 2012-05-09 MED ORDER — DIBUCAINE 1 % RE OINT
1.0000 | TOPICAL_OINTMENT | RECTAL | Status: DC | PRN
Start: 2012-05-09 — End: 2012-05-10

## 2012-05-09 NOTE — Progress Notes (Signed)
UR chart review completed.  

## 2012-05-09 NOTE — Anesthesia Postprocedure Evaluation (Signed)
  Anesthesia Post-op Note  Patient: Stacy Cole  Procedure(s) Performed: * No surgery found *  Patient Location: Mother/Baby  Anesthesia Type: Epidural  Level of Consciousness: awake, alert  and oriented  Airway and Oxygen Therapy: Patient Spontanous Breathing  Post-op Pain: mild  Post-op Assessment: Patient's Cardiovascular Status Stable and Respiratory Function Stable  Post-op Vital Signs: stable  Complications: No apparent anesthesia complications

## 2012-05-09 NOTE — Op Note (Signed)
Delivery Note At 12:56 AM a viable and healthy female was delivered via Vaginal, Spontaneous Delivery (Presentation: ;  ).  APGAR: , ; weight 8#11.   Placenta status: , Spontaneous.  Cord:  with the following complications: .  Cord pH: pending A ten second shoulder dystocia noted and resolved with increased maternal effort there were two nurses at bedside ready to perform other measures but they were not required NICU team called but baby was vigorous at one minute moving all 4 extremities Anesthesia:  CLE Episiotomy: none Lacerations: 1st degree midline tear Suture Repair: 2.0 vicryl rapide Est. Blood Loss (mL): 300  Mom to postpartum.  Baby to nursery-stable.  Devine Klingel H. 05/09/2012, 1:17 AM

## 2012-05-09 NOTE — Progress Notes (Signed)

## 2012-05-09 NOTE — Progress Notes (Signed)
Post Partum Day 1/2 Subjective: no complaints and tolerating PO  Objective: Blood pressure 127/72, pulse 103, temperature 98.1 F (36.7 C), temperature source Oral, resp. rate 20, height 5\' 7"  (1.702 m), weight 100.699 kg (222 lb), last menstrual period 08/10/2011, unknown if currently breastfeeding.  Physical Exam:  General: alert, cooperative and no distress Lochia: appropriate Uterine Fundus: firm Episiotomy, laceration : na DVT Evaluation: No evidence of DVT seen on physical exam.   Basename 05/09/12 0523 05/08/12 0825  HGB 9.4* 9.8*  HCT 28.2* 28.9*    Assessment/Plan: Plan for discharge tomorrow Anemia  P:Iron   LOS: 1 day   Dina Warbington E 05/09/2012, 2:45 PM

## 2012-05-10 NOTE — Discharge Summary (Signed)
Obstetric Discharge Summary Reason for Admission: induction of labor and macrosomia and Class A2DM and h/o 3rd trimester bleeding and succenturiate lobe and AMA Prenatal Procedures: none and cervical ripening Intrapartum Procedures: spontaneous vaginal delivery and GBS prophylaxis Postpartum Procedures: none Complications-Operative and Postpartum: none Hemoglobin  Date Value Range Status  05/09/2012 9.4* 12.0 - 15.0 g/dL Final     HCT  Date Value Range Status  05/09/2012 28.2* 36.0 - 46.0 % Final    Physical Exam:  General: alert, cooperative and no distress Lochia: appropriate Uterine Fundus: firm Incision: no problems id'd DVT Evaluation: No evidence of DVT seen on physical exam. Negative Homan's sign. No cords or calf tenderness. No significant calf/ankle edema.  Discharge Diagnoses: Term Pregnancy-delivered and anemia and AMA and Class A2 DM and abnormal placenta with succenturiate lobe diagnoses antepartum and confirmed at delivery and macrosomia Breast feeding Desired circumcision but I am not sure of the financial issues Discharge Information: Date: 05/10/2012 Activity: unrestricted Diet: routine Medications: PNV, Ibuprofen and Iron Condition: stable Instructions: refer to practice specific booklet Discharge to: home   Newborn Data: Live born female  Birth Weight: 8 lb 11 oz (3941 g) APGAR: 5, 9  Home with mother.  Yaiza Palazzola H. 05/10/2012, 9:27 AM

## 2012-05-10 NOTE — Progress Notes (Signed)
Patient ID: Stacy Cole, female   DOB: 1976/07/26, 36 y.o.   MRN: [id number] See discharge summary  S/p induced SVD at 39w0 , with macrosomia and A2DM and AMA and succenturiate lobe and h/o 3rd trimester bleeding Now PPD#2  Ready for discharge

## 2012-05-11 LAB — TYPE AND SCREEN: Antibody Screen: NEGATIVE

## 2012-07-14 IMAGING — CT CT ANGIO CHEST
2 of 6 series · 19 of 36 positions shown · IV contrast (APPLIED)
Comparison: Chest x-ray 12/15/2010

CLINICAL DATA: Short of breath.

CT ANGIOGRAPHY CHEST WITH CONTRAST
TECHNIQUE: Multidetector CT imaging of the chest was performed
using the standard protocol during bolus administration of
intravenous contrast.  Multiplanar CT image reconstructions
including MIPs were obtained to evaluate the vascular anatomy.
Contrast:  80 ml Omnipaque 350 IV

[Series 5: pe 1.0 b25f · axial · 0.61mm/px · z∈[+1016,+1262]mm · 18 of 273 slices shown]
[im 14/273  lung]
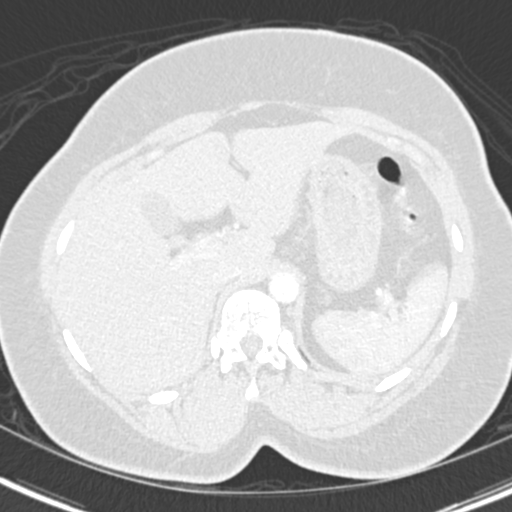
[im 28/273  mediastinal]
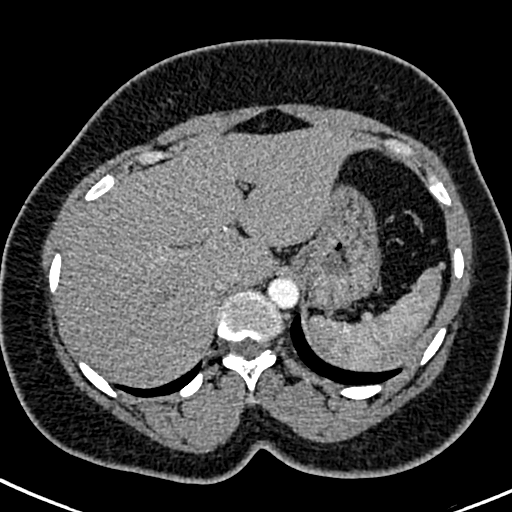
[im 41/273  lung]
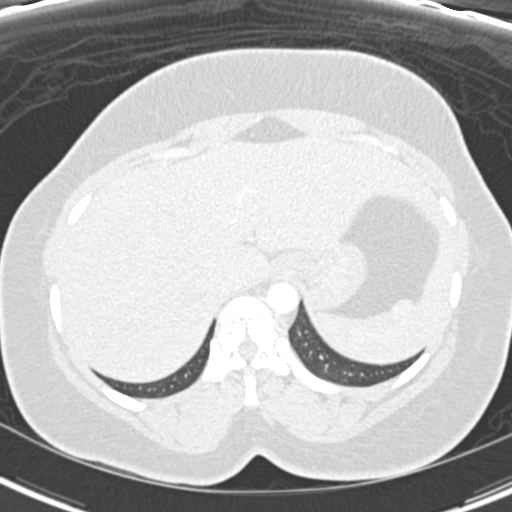
[im 55/273  mediastinal]
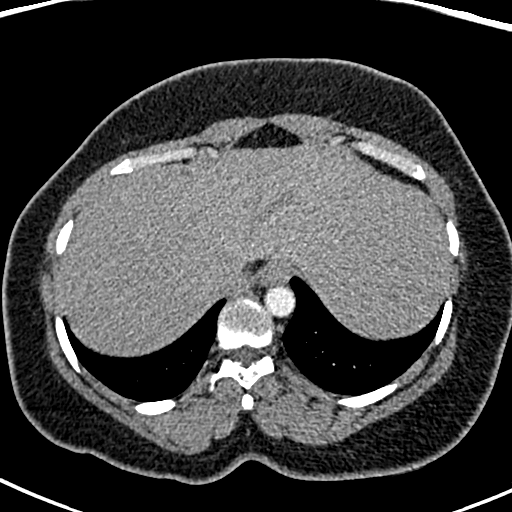
[im 69/273  lung]
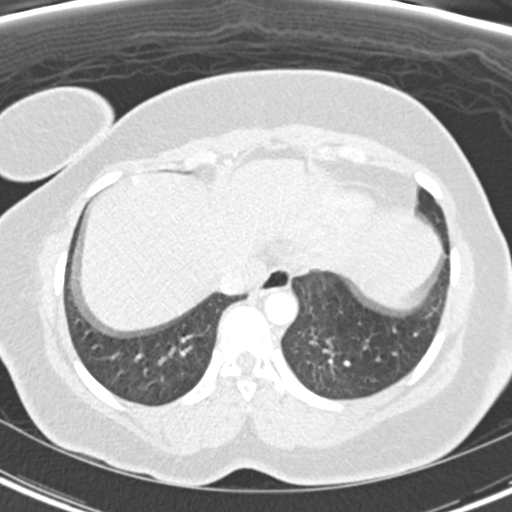
[im 82/273  mediastinal]
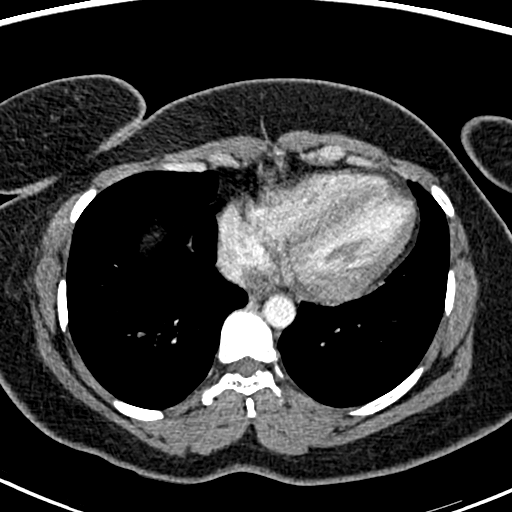
[im 96/273  lung]
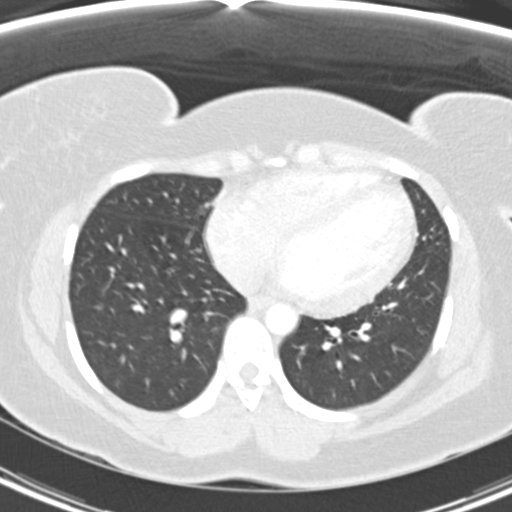
[im 109/273  mediastinal]
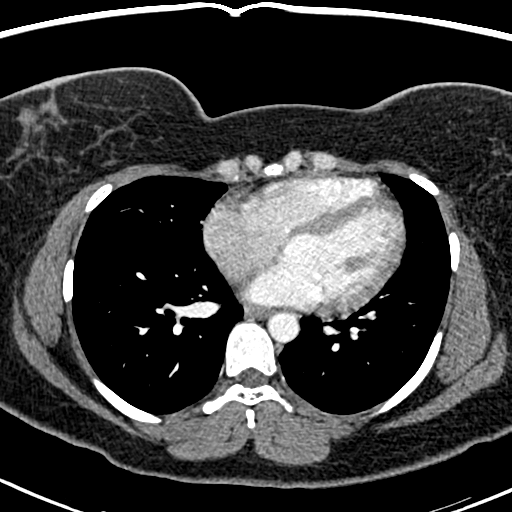
[im 123/273  lung]
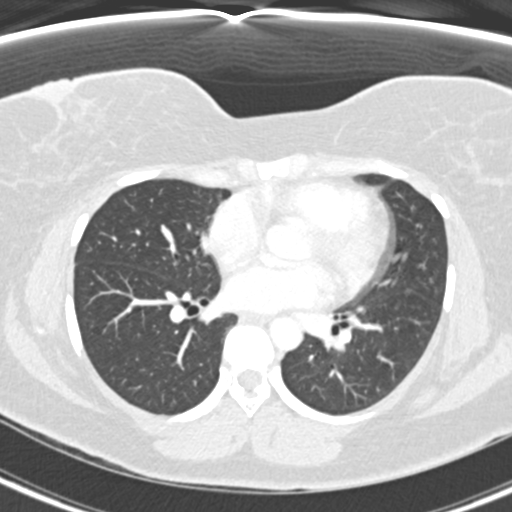
[im 150/273  mediastinal]
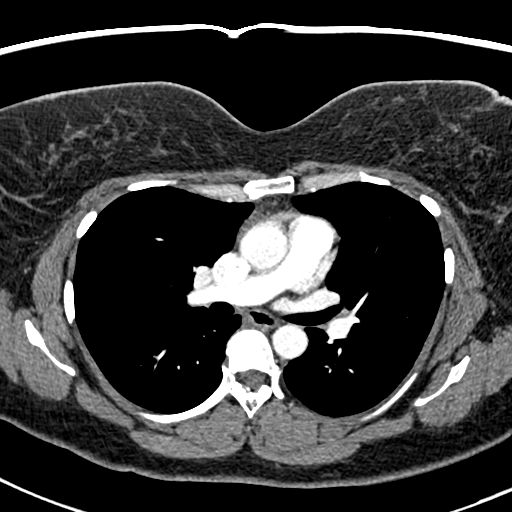
[im 164/273  lung]
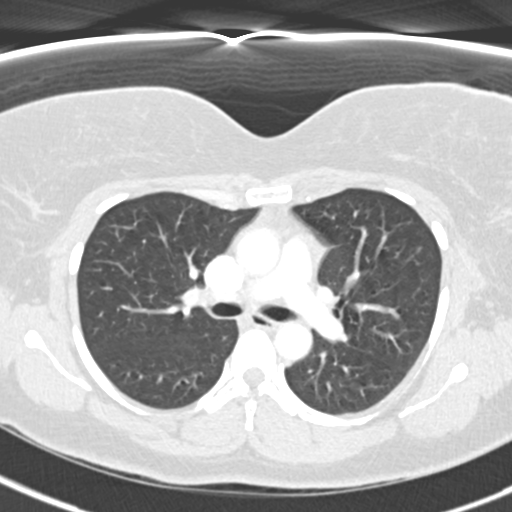
[im 177/273  mediastinal]
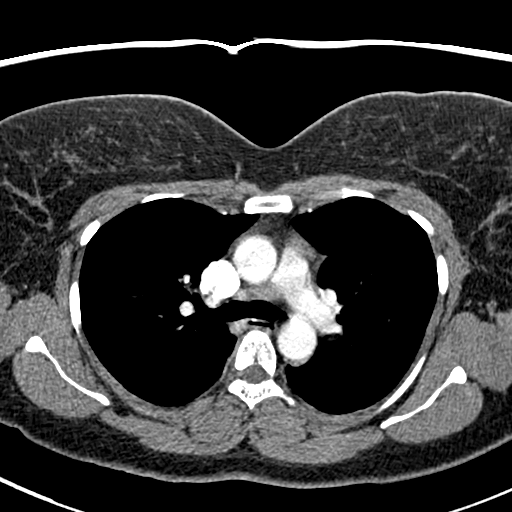
[im 191/273  lung]
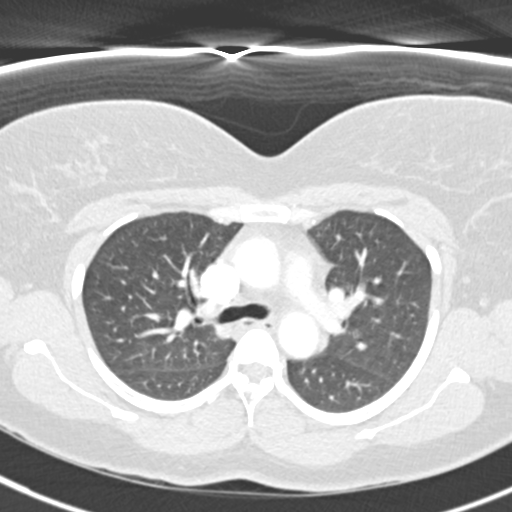
[im 205/273  mediastinal]
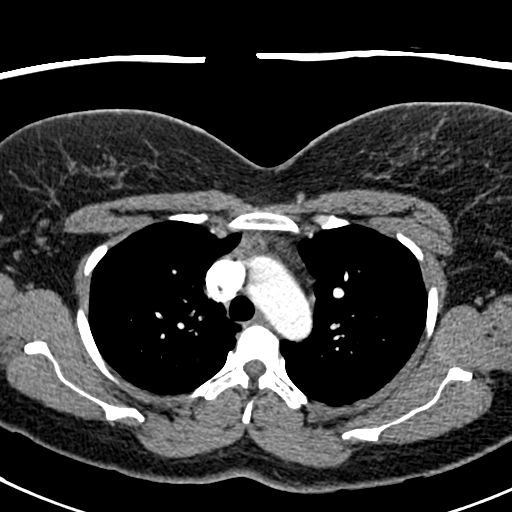
[im 218/273  lung]
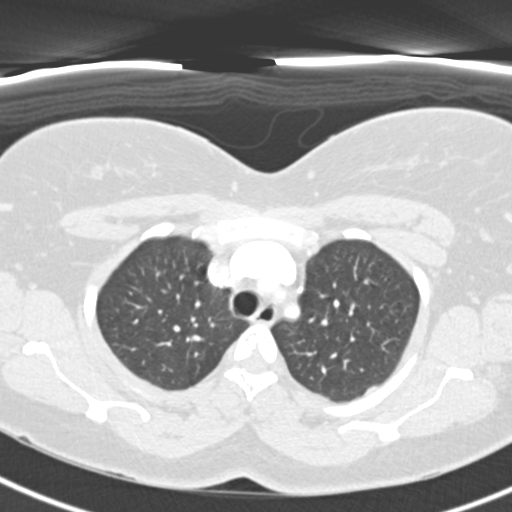
[im 232/273  mediastinal]
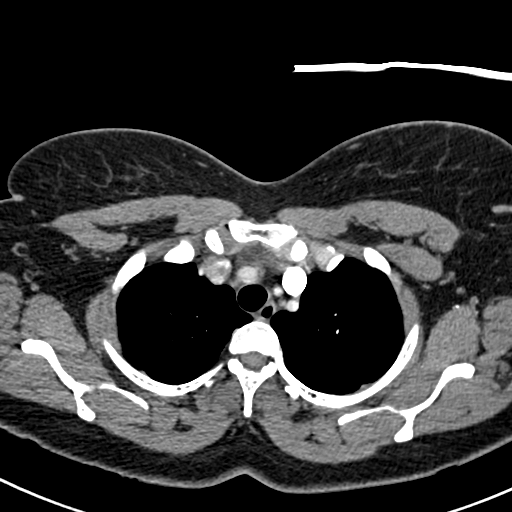
[im 245/273  lung]
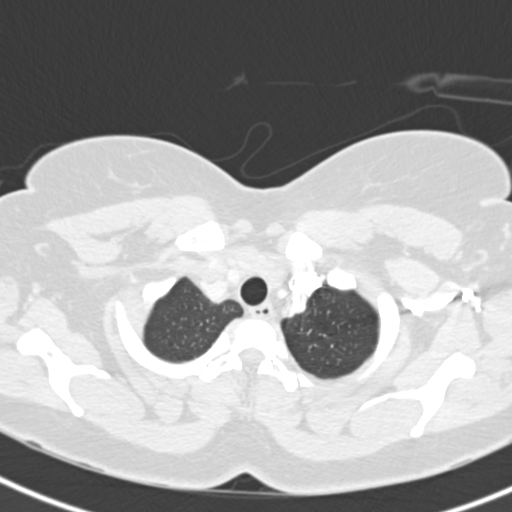
[im 259/273  mediastinal]
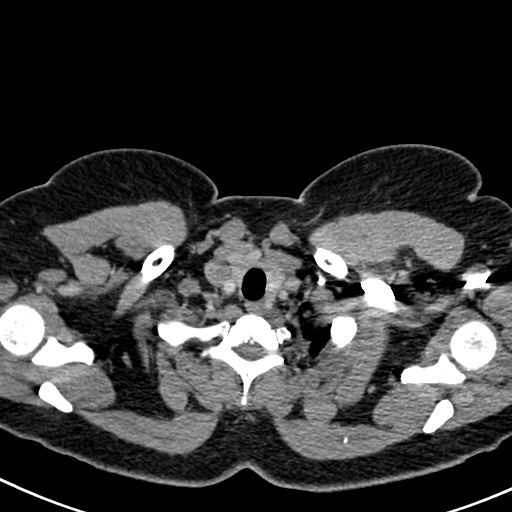

[Series 8: pe 2.0 coronal · coronal · 0.53mm/px · 1 of 132 slices shown]
[im 66/132  mediastinal]
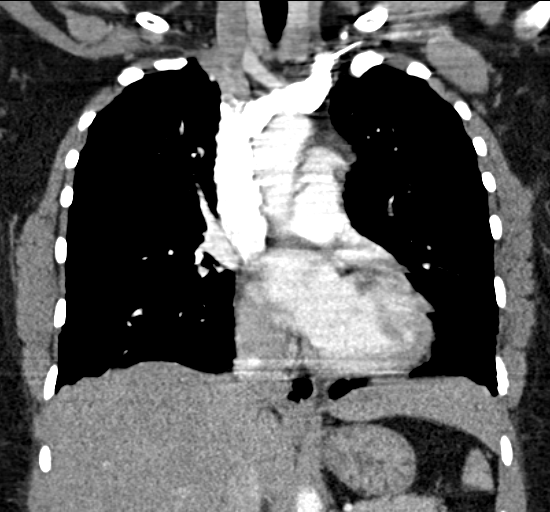

[19 of 36 positions shown; findings below may reference images not displayed]

FINDINGS: Negative for pulmonary embolism.  Lungs are clear
without infiltrate or effusion.  No mass or adenopathy.  Small
calcified granuloma in the right medial lung base.

The heart size is normal.  No pericardial effusion.  The thoracic
aorta is intact without aneurysm or dissection.

Review of the MIP images confirms the above findings.
IMPRESSION: Negative for pulmonary embolism.  No acute abnormality in the
chest.

## 2012-07-14 IMAGING — CR DG CHEST 2V
2 series · 2 of 2 positions shown · non-contrast
Comparison: 12/14/2005

CLINICAL DATA: Short of breath

CHEST - 2 VIEW

[w chest pa]
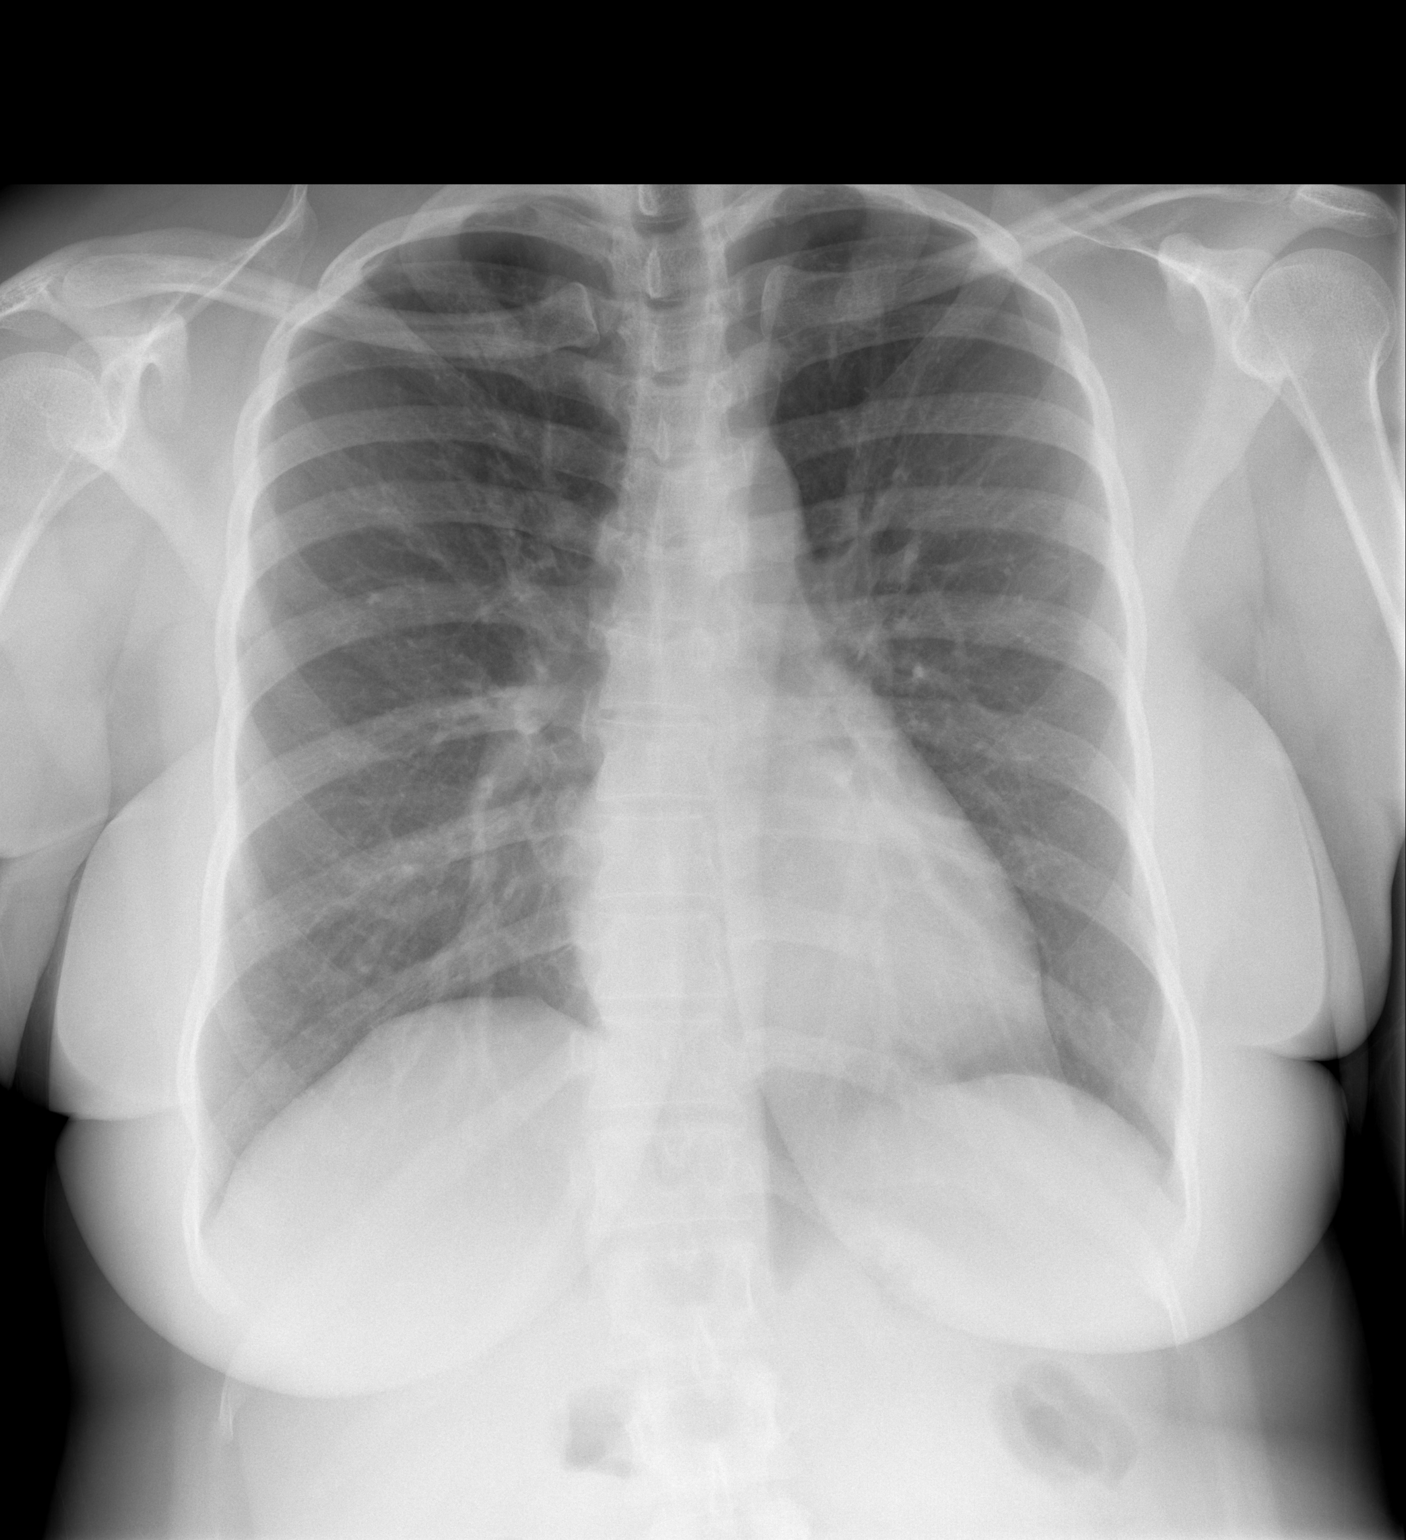

[w chest lat]
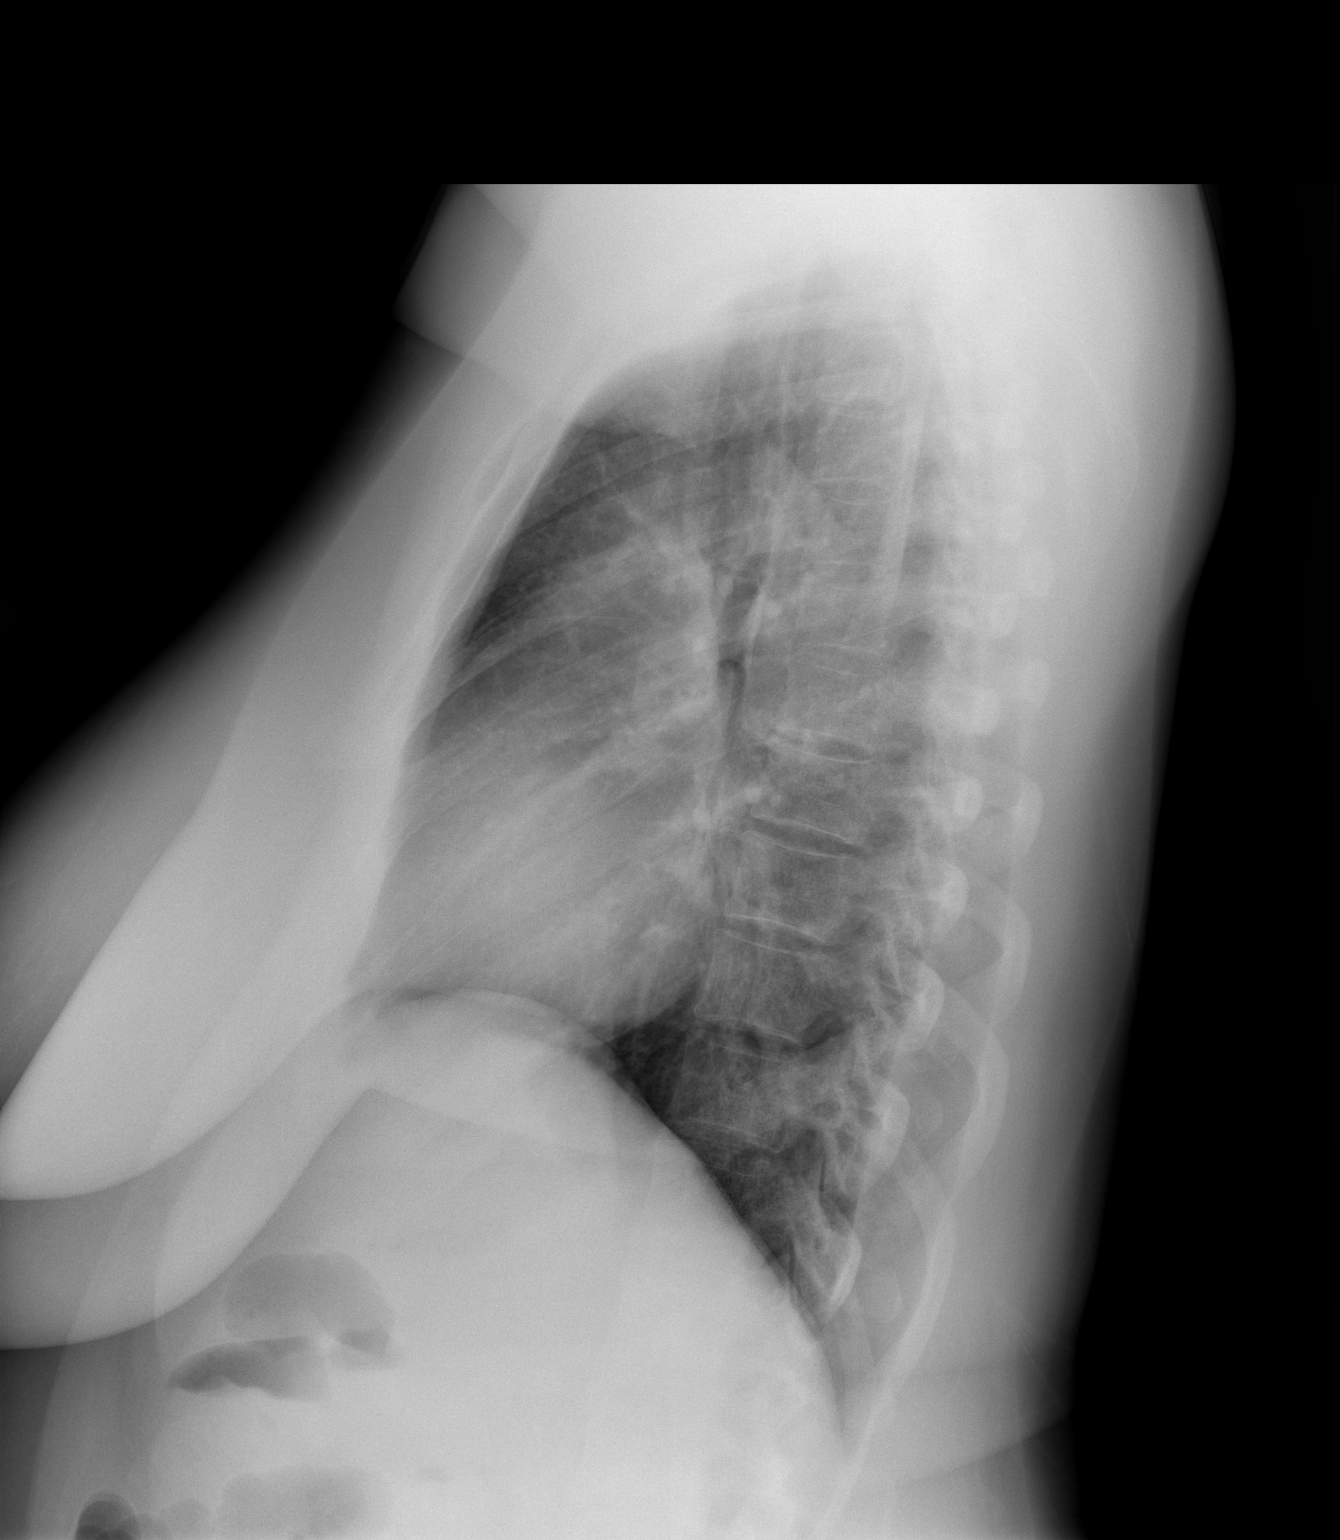

[2 of 2 positions shown; findings below may reference images not displayed]

FINDINGS: Heart and mediastinal contours normal.  Lungs clear.  No
pleural fluid.  Osseous structures and soft tissues unremarkable.
IMPRESSION: No acute or significant findings.

## 2013-05-01 IMAGING — US US OB COMP LESS 14 WK
1 series · 14 of 28 positions shown · non-contrast
Comparison: None.

CLINICAL DATA: Pelvic pain.  Vaginal bleeding.  7-week-5-day
gestational age by LMP.

OBSTETRIC <14 WK US AND TRANSVAGINAL OB US
TECHNIQUE: Both transabdominal and transvaginal ultrasound
examinations were performed for complete evaluation of the
gestation as well as the maternal uterus, adnexal regions, and
pelvic cul-de-sac.  Transvaginal technique was performed to assess
early pregnancy.

[Series 1: us ob comp less 14 wk · 0.33mm/px · 14 of 57 slices shown]
[im 3/57]
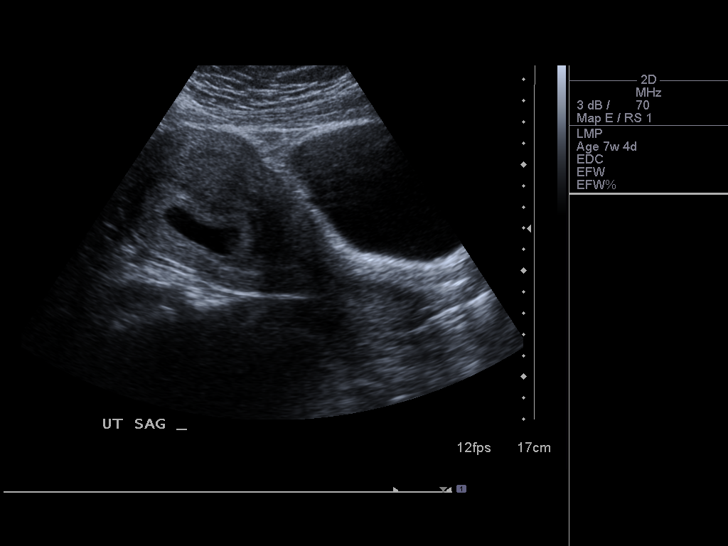
[im 7/57]
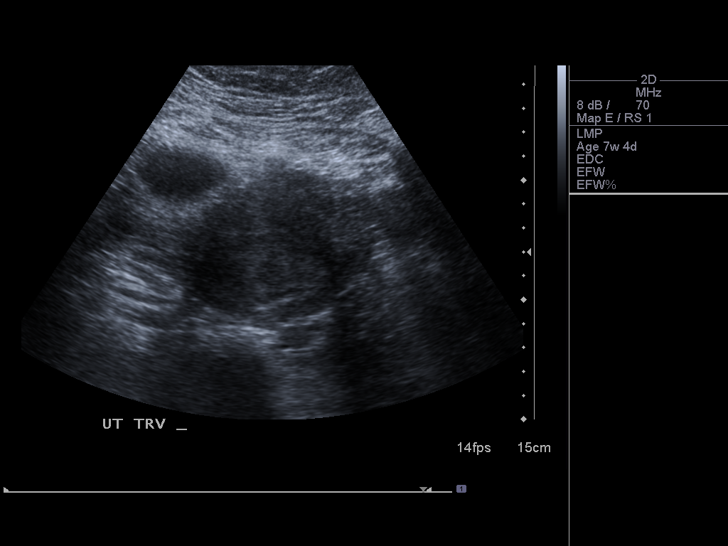
[im 11/57]
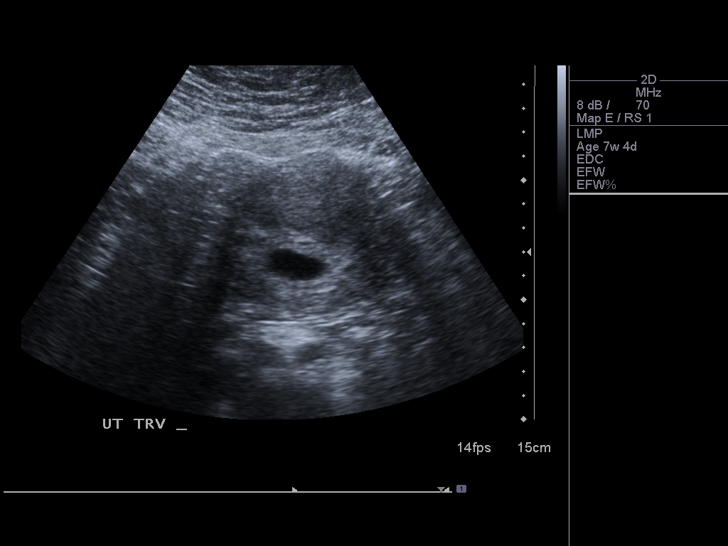
[im 15/57]
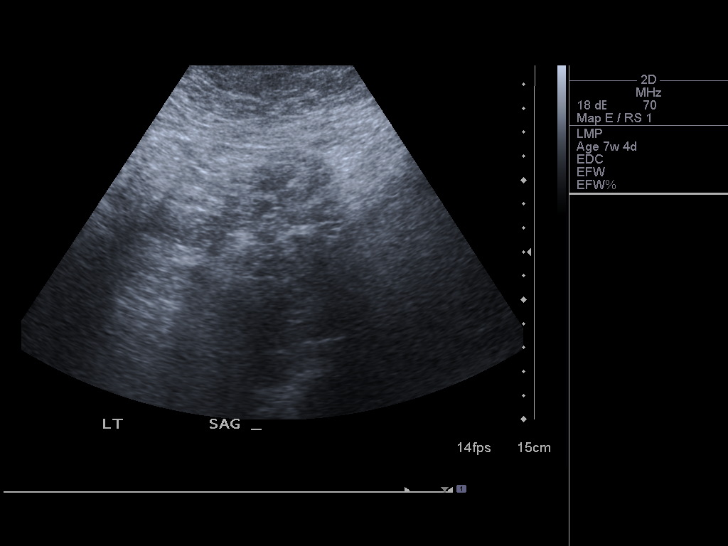
[im 19/57]
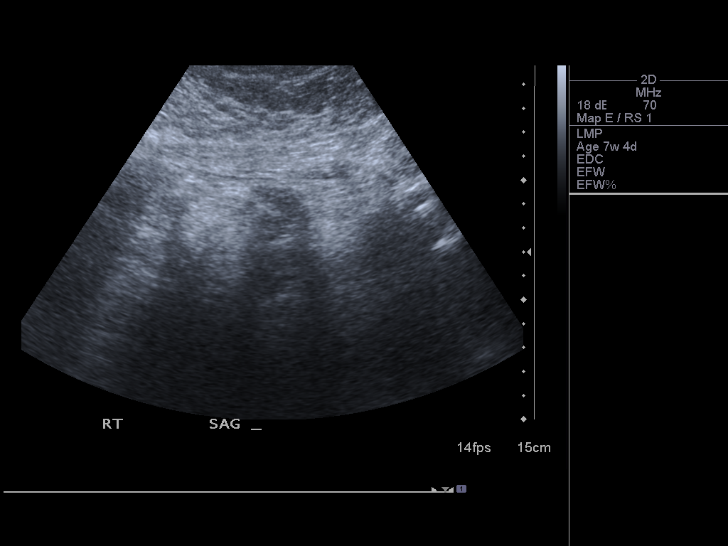
[im 23/57]
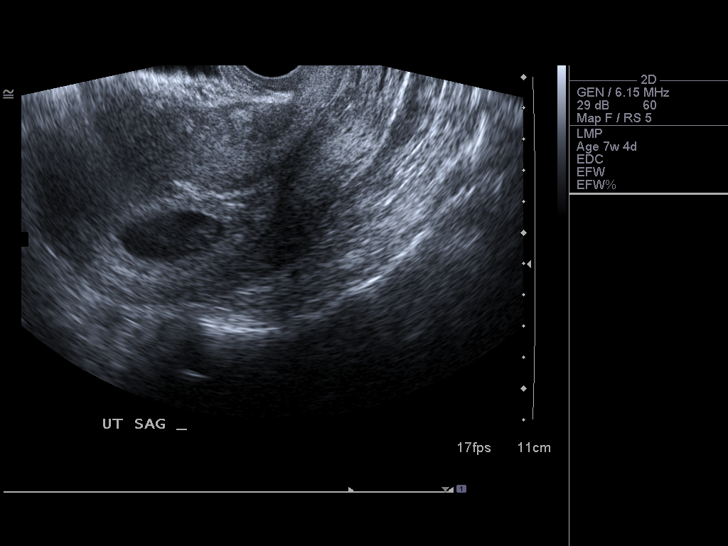
[im 27/57]
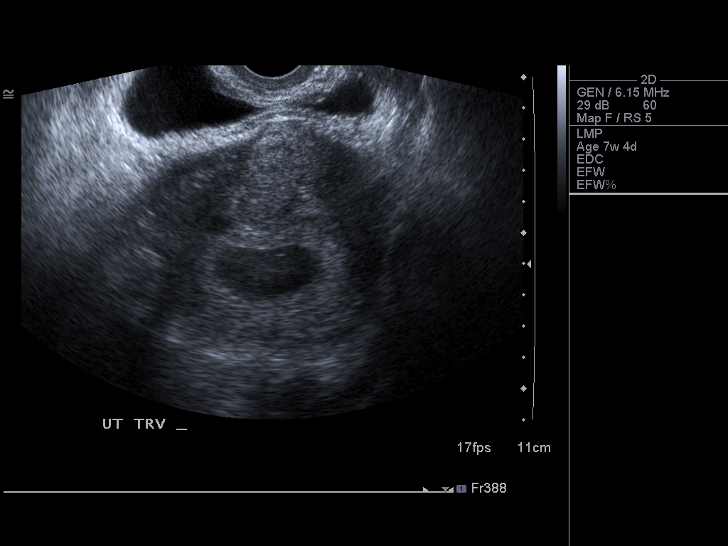
[im 32/57]
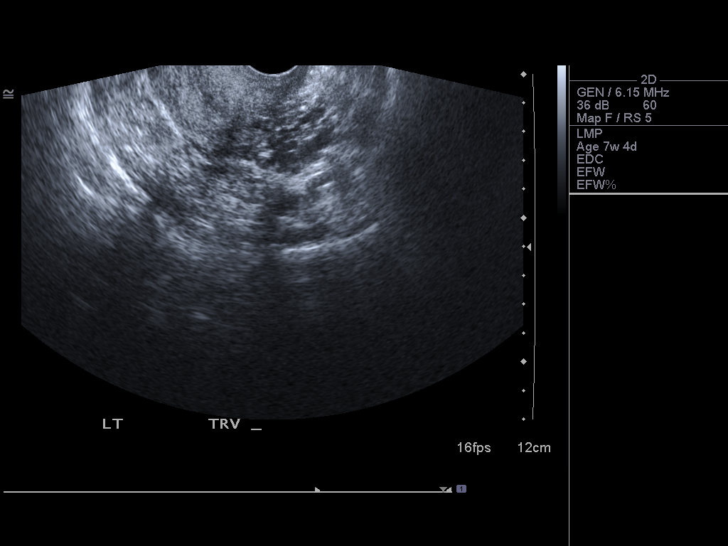
[im 36/57]
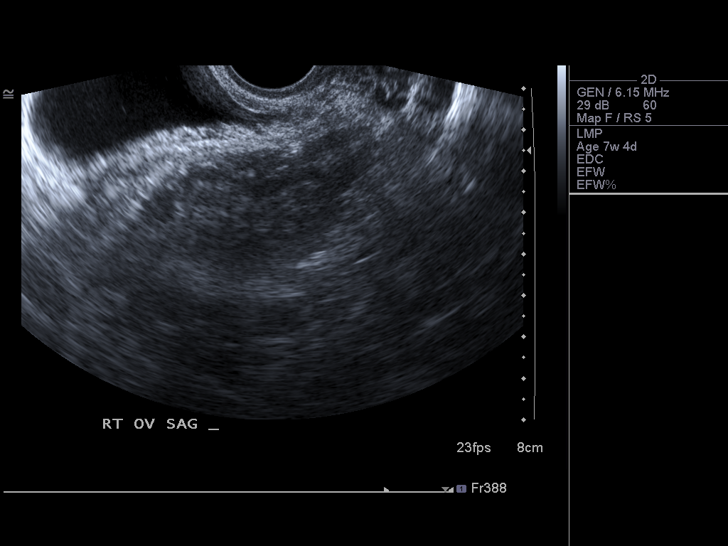
[im 40/57]
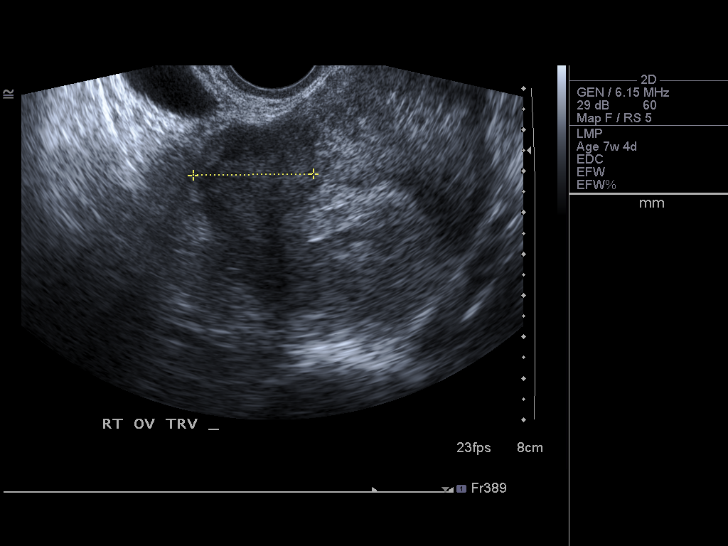
[im 44/57]
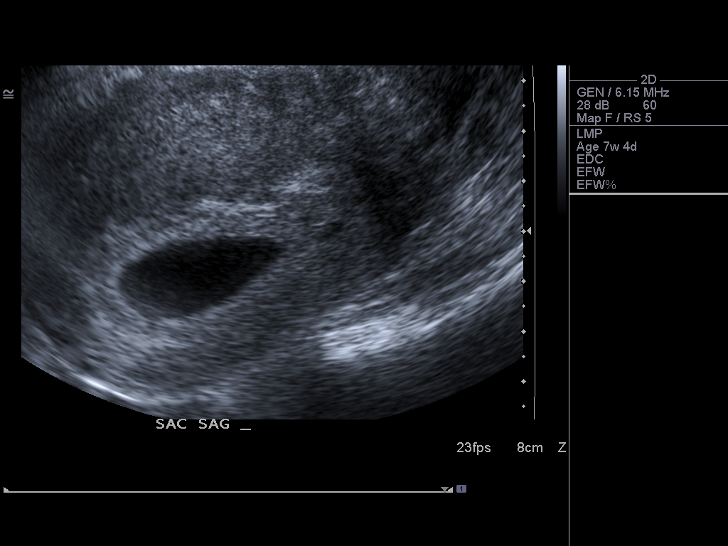
[im 48/57]
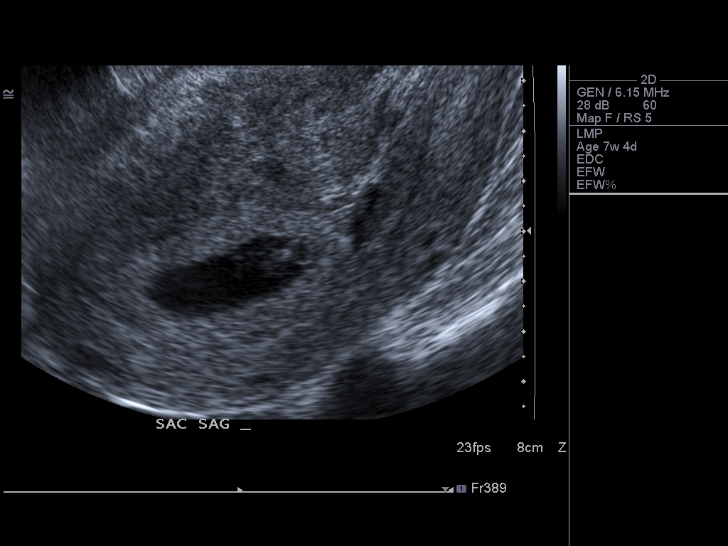
[im 52/57]
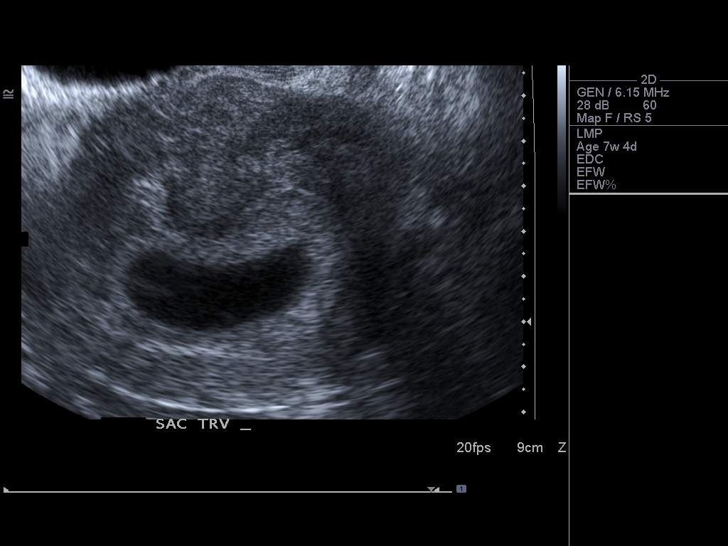
[im 57/57]
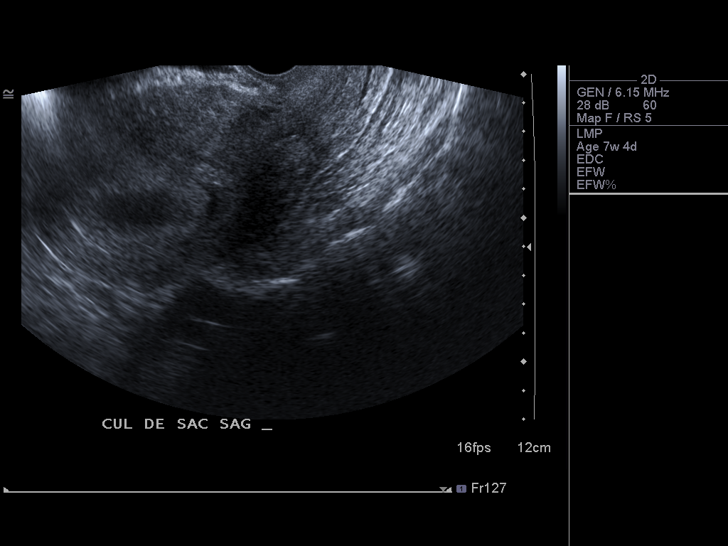

[14 of 28 positions shown; findings below may reference images not displayed]

Intrauterine gestational sac:  Visualized/normal in shape.
Yolk sac: Visualized
Embryo: Visualized
Cardiac Activity: Observed
Heart Rate: 168 bpm

CRL: 11   mm  7   w  2   d          US EDC: 05/16/2012

Maternal uterus/adnexae:
A tiny subchorionic hemorrhage is noted.  No fibroids identified.
The right ovary is normal in appearance.  Left ovary not directly
visualized, however no left adnexal mass identified.  No evidence
of free fluid.
IMPRESSION: 1.  Single living IUP.  Ultrasound EGA is concordant with LMP.
2.  Tiny subchorionic hemorrhage noted.

## 2014-02-26 ENCOUNTER — Other Ambulatory Visit: Payer: Self-pay

## 2014-02-27 LAB — CYTOLOGY - PAP

## 2014-03-13 LAB — OB RESULTS CONSOLE ABO/RH: RH Type: POSITIVE

## 2014-03-13 LAB — OB RESULTS CONSOLE RPR: RPR: NONREACTIVE

## 2014-03-13 LAB — OB RESULTS CONSOLE RUBELLA ANTIBODY, IGM: Rubella: IMMUNE

## 2014-03-13 LAB — OB RESULTS CONSOLE GC/CHLAMYDIA
Chlamydia: NEGATIVE
Gonorrhea: NEGATIVE

## 2014-03-13 LAB — OB RESULTS CONSOLE HEPATITIS B SURFACE ANTIGEN: Hepatitis B Surface Ag: NEGATIVE

## 2014-03-13 LAB — OB RESULTS CONSOLE HIV ANTIBODY (ROUTINE TESTING): HIV: NONREACTIVE

## 2014-03-13 LAB — OB RESULTS CONSOLE ANTIBODY SCREEN: Antibody Screen: NEGATIVE

## 2014-05-23 ENCOUNTER — Encounter: Payer: Managed Care, Other (non HMO) | Attending: Obstetrics and Gynecology

## 2014-05-23 VITALS — Ht 65.0 in | Wt 221.5 lb

## 2014-05-23 DIAGNOSIS — O99810 Abnormal glucose complicating pregnancy: Secondary | ICD-10-CM | POA: Diagnosis not present

## 2014-05-23 DIAGNOSIS — Z713 Dietary counseling and surveillance: Secondary | ICD-10-CM | POA: Diagnosis not present

## 2014-05-24 NOTE — Progress Notes (Signed)
  Patient was seen on 05/23/14 for Gestational Diabetes self-management . The following learning objectives were met by the patient :   States the definition of Gestational Diabetes  States why dietary management is important in controlling blood glucose  Describes the effects of carbohydrates on blood glucose levels  Demonstrates ability to create a balanced meal plan  Demonstrates carbohydrate counting   States when to check blood glucose levels  Demonstrates proper blood glucose monitoring techniques  States the effect of stress and exercise on blood glucose levels  States the importance of limiting caffeine and abstaining from alcohol and smoking  Plan:  Aim for 2 Carb Choices per meal (30 grams) +/- 1 either way for breakfast Aim for 3 Carb Choices per meal (45 grams) +/- 1 either way from lunch and dinner Aim for 1-2 Carbs per snack Begin reading food labels for Total Carbohydrate and sugar grams of foods Consider  increasing your activity level by walking daily as tolerated Begin checking BG before breakfast and 2 hours after first bit of breakfast, lunch and dinner after  as directed by MD  Take medication  as directed by MD  Blood glucose monitor given: One Touch Ultra 2 Lot # [id number] X Exp: 12/2014 Blood glucose reading: $RemoveBeforeDE'101mg'kbWtKVRwPEcbtsv$ /dl  Patient instructed to monitor glucose levels: FBS: 60 - <90 2 hour: <120  Patient received the following handouts:  Nutrition Diabetes and Pregnancy  Carbohydrate Counting List  Meal Planning worksheet  Patient will be seen for follow-up as needed.

## 2014-08-20 ENCOUNTER — Other Ambulatory Visit: Payer: Self-pay

## 2014-08-20 LAB — OB RESULTS CONSOLE GBS: STREP GROUP B AG: POSITIVE

## 2014-08-31 NOTE — L&D Delivery Note (Signed)
Pt was admitted with SROM. She had pit aug and progressed along a normal labor curve. She pushed once and had a SVD of one live viable infant over an intact perineum. Placenta- S/I. EBL-400cc. Baby to NBN.

## 2014-09-06 ENCOUNTER — Encounter (HOSPITAL_COMMUNITY): Payer: Self-pay | Admitting: *Deleted

## 2014-09-06 ENCOUNTER — Telehealth (HOSPITAL_COMMUNITY): Payer: Self-pay | Admitting: *Deleted

## 2014-09-06 NOTE — Telephone Encounter (Signed)
Preadmission screen  

## 2014-09-08 ENCOUNTER — Encounter (HOSPITAL_COMMUNITY): Payer: Self-pay | Admitting: *Deleted

## 2014-09-08 ENCOUNTER — Inpatient Hospital Stay (HOSPITAL_COMMUNITY)
Admission: AD | Admit: 2014-09-08 | Discharge: 2014-09-08 | Disposition: A | Discharge status: Home / Self Care | Payer: Medicaid Other | Source: Ambulatory Visit | Attending: Obstetrics and Gynecology | Admitting: Obstetrics and Gynecology

## 2014-09-08 DIAGNOSIS — O4693 Antepartum hemorrhage, unspecified, third trimester: Secondary | ICD-10-CM | POA: Diagnosis present

## 2014-09-08 DIAGNOSIS — Z3A38 38 weeks gestation of pregnancy: Secondary | ICD-10-CM | POA: Diagnosis not present

## 2014-09-08 DIAGNOSIS — O2343 Unspecified infection of urinary tract in pregnancy, third trimester: Secondary | ICD-10-CM | POA: Insufficient documentation

## 2014-09-08 DIAGNOSIS — O09523 Supervision of elderly multigravida, third trimester: Secondary | ICD-10-CM | POA: Insufficient documentation

## 2014-09-08 DIAGNOSIS — Z3490 Encounter for supervision of normal pregnancy, unspecified, unspecified trimester: Secondary | ICD-10-CM

## 2014-09-08 LAB — WET PREP, GENITAL
Clue Cells Wet Prep HPF POC: NONE SEEN
Trich, Wet Prep: NONE SEEN
Yeast Wet Prep HPF POC: NONE SEEN

## 2014-09-08 LAB — URINE MICROSCOPIC-ADD ON

## 2014-09-08 LAB — URINALYSIS, ROUTINE W REFLEX MICROSCOPIC
Bilirubin Urine: NEGATIVE
Glucose, UA: NEGATIVE mg/dL
Ketones, ur: NEGATIVE mg/dL
Nitrite: NEGATIVE
PH: 7 (ref 5.0–8.0)
Protein, ur: NEGATIVE mg/dL
Specific Gravity, Urine: <1.005 — ABNORMAL LOW (ref 1.005–1.030)
Urobilinogen, UA: 0.2 mg/dL (ref 0.0–1.0)

## 2014-09-08 MED ORDER — OXYCODONE-ACETAMINOPHEN 5-325 MG PO TABS
1.0000 | ORAL_TABLET | Freq: Once | ORAL | Status: AC
  Administered 2014-09-08: 1 via ORAL
  Filled 2014-09-08: qty 1

## 2014-09-08 MED ORDER — ACETAMINOPHEN 325 MG PO TABS
650.0000 mg | ORAL_TABLET | Freq: Once | ORAL | Status: AC
  Administered 2014-09-08: 650 mg via ORAL
  Filled 2014-09-08: qty 2

## 2014-09-08 MED ORDER — NITROFURANTOIN MONOHYD MACRO 100 MG PO CAPS: 100.0000 mg | ORAL_CAPSULE | Freq: Two times a day (BID) | ORAL | Status: DC

## 2014-09-08 NOTE — Discharge Instructions (Signed)
Pelvic Rest Pelvic rest is sometimes recommended for women when:   The placenta is partially or completely covering the opening of the cervix (placenta previa).  There is bleeding between the uterine wall and the amniotic sac in the first trimester (subchorionic hemorrhage).  The cervix begins to open without labor starting (incompetent cervix, cervical insufficiency).  The labor is too early (preterm labor). HOME CARE INSTRUCTIONS  Do not have sexual intercourse, stimulation, or an orgasm.  Do not use tampons, douche, or put anything in the vagina.  Do not lift anything over 10 pounds (4.5 kg).  Avoid strenuous activity or straining your pelvic muscles. SEEK MEDICAL CARE IF:  You have any vaginal bleeding during pregnancy. Treat this as a potential emergency.  You have cramping pain felt low in the stomach (stronger than menstrual cramps).  You notice vaginal discharge (watery, mucus, or bloody).  You have a low, dull backache.  There are regular contractions or uterine tightening. SEEK IMMEDIATE MEDICAL CARE IF: You have vaginal bleeding and have placenta previa.  Document Released: 12/12/2010 Document Revised: 11/09/2011 Document Reviewed: 12/12/2010 [facility] Patient Information 2015 [place], [place]. This information is not intended to replace advice given to you by your health care provider. Make sure you discuss any questions you have with your health care provider. Urinary Tract Infection Urinary tract infections (UTIs) can develop anywhere along your urinary tract. Your urinary tract is your body's drainage system for removing wastes and extra water. Your urinary tract includes two kidneys, two ureters, a bladder, and a urethra. Your kidneys are a pair of bean-shaped organs. Each kidney is about the size of your fist. They are located below your ribs, one on each side of your spine. CAUSES Infections are caused by microbes, which are microscopic organisms, including  fungi, viruses, and bacteria. These organisms are so small that they can only be seen through a microscope. Bacteria are the microbes that most commonly cause UTIs. SYMPTOMS  Symptoms of UTIs may vary by age and gender of the patient and by the location of the infection. Symptoms in young women typically include a frequent and intense urge to urinate and a painful, burning feeling in the bladder or urethra during urination. Older women and men are more likely to be tired, shaky, and weak and have muscle aches and abdominal pain. A fever may mean the infection is in your kidneys. Other symptoms of a kidney infection include pain in your back or sides below the ribs, nausea, and vomiting. DIAGNOSIS To diagnose a UTI, your caregiver will ask you about your symptoms. Your caregiver also will ask to provide a urine sample. The urine sample will be tested for bacteria and white blood cells. White blood cells are made by your body to help fight infection. TREATMENT  Typically, UTIs can be treated with medication. Because most UTIs are caused by a bacterial infection, they usually can be treated with the use of antibiotics. The choice of antibiotic and length of treatment depend on your symptoms and the type of bacteria causing your infection. HOME CARE INSTRUCTIONS  If you were prescribed antibiotics, take them exactly as your caregiver instructs you. Finish the medication even if you feel better after you have only taken some of the medication.  Drink enough water and fluids to keep your urine clear or pale yellow.  Avoid caffeine, tea, and carbonated beverages. They tend to irritate your bladder.  Empty your bladder often. Avoid holding urine for long periods of time.  Empty your  bladder before and after sexual intercourse.  After a bowel movement, women should cleanse from front to back. Use each tissue only once. SEEK MEDICAL CARE IF:   You have back pain.  You develop a fever.  Your symptoms  do not begin to resolve within 3 days. SEEK IMMEDIATE MEDICAL CARE IF:   You have Kwiecinski back pain or lower abdominal pain.  You develop chills.  You have nausea or vomiting.  You have continued burning or discomfort with urination. MAKE SURE YOU:   Understand these instructions.  Will watch your condition.  Will get help right away if you are not doing well or get worse. Document Released: 05/27/2005 Document Revised: 02/16/2012 Document Reviewed: 09/25/2011 [facility] Patient Information 2015 [place]. This information is not intended to replace advice given to you by your health care provider. Make sure you discuss any questions you have with your health care provider.

## 2014-09-08 NOTE — MAU Note (Signed)
Back pain and spotting since Thursday.  Had cervical exam Thursday as well.  Denies LOF.  Occasional contractions.

## 2014-09-08 NOTE — MAU Provider Note (Signed)
History     CSN: [id number]  Arrival date and time: 09/08/14 4098   First Provider Initiated Contact with Patient 09/08/14 1020      Chief Complaint  Patient presents with  . Vaginal Bleeding   HPI Comments: Stacy Cole 39 y.o. [id number] [redacted]w[redacted]d presents to MAU with vaginal bleeding and back pain that started Thursday after her cervical exam. She has just noticed this when she wipes and is not wearing pad. The pain is 4/10 and she has not taken any medications. She denies any LOF and has an occassional contraction.   Vaginal Bleeding Associated symptoms include back pain.      Past Medical History  Diagnosis Date  . Depression   . Chicken pox   . AMA (advanced maternal age) multigravida 35+   . Gestational diabetes     glyburide    History reviewed. No pertinent past surgical history.  Family History  Problem Relation Age of Onset  . Hypertension Maternal Aunt     History  Substance Use Topics  . Smoking status: Never Smoker   . Smokeless tobacco: Never Used  . Alcohol Use: No    Allergies: No Known Allergies  Prescriptions prior to admission  Medication Sig Dispense Refill Last Dose  . glyBURIDE (DIABETA) 2.5 MG tablet Take 5 mg by mouth 2 (two) times daily.    09/07/2014 at Unknown time  . prenatal vitamin w/FE, FA (PRENATAL 1 + 1) 27-1 MG TABS Take 1 tablet by mouth daily.   09/07/2014 at Unknown time    Review of Systems  Constitutional: Negative.   HENT: Negative.   Eyes: Negative.   Cardiovascular: Negative.   Genitourinary: Positive for vaginal bleeding.       Vaginal spotting  Musculoskeletal: Positive for back pain.  Skin: Negative.   Neurological: Negative.   Psychiatric/Behavioral: Negative.    Physical Exam   Blood pressure 122/72, pulse 89, temperature 98.3 F (36.8 C), temperature source Oral, resp. rate 16, height  (1.702 m), weight 99.338 kg (219 lb), last menstrual period 12/13/2013, unknown if currently breastfeeding.  Physical Exam   Constitutional: She is oriented to person, place, and time. She appears well-developed and well-nourished. No distress.  HENT:  Head: Normocephalic.  GI: Soft. She exhibits no distension. There is no tenderness. There is no rebound and no guarding.  Large hemorrhoids  Genitourinary:  Genital:external negative Vaginal:pink tinges vaginal discharge  Cervix: dilated 1cm with bloody mucous plug Bimanual: nontender   Musculoskeletal: Normal range of motion.  Neurological: She is alert and oriented to person, place, and time.  Skin: Skin is warm and dry.  Psychiatric: She has a normal mood and affect. Her behavior is normal. Judgment and thought content normal.   Results for orders placed or performed during the hospital encounter of 09/08/14 (from the past 24 hour(s))  Urinalysis, Routine w reflex microscopic     Status: Abnormal   Collection Time: 09/08/14  9:55 AM  Result Value Ref Range   Color, Urine YELLOW YELLOW   APPearance CLEAR CLEAR   Specific Gravity, Urine <1.005 (L) 1.005 - 1.030   pH 7.0 5.0 - 8.0   Glucose, UA NEGATIVE NEGATIVE mg/dL   Hgb urine dipstick LARGE (A) NEGATIVE   Bilirubin Urine NEGATIVE NEGATIVE   Ketones, ur NEGATIVE NEGATIVE mg/dL   Protein, ur NEGATIVE NEGATIVE mg/dL   Urobilinogen, UA 0.2 0.0 - 1.0 mg/dL   Nitrite NEGATIVE NEGATIVE   Leukocytes, UA LARGE (A) NEGATIVE  Urine microscopic-add on  Status: Abnormal   Collection Time: 09/08/14  9:55 AM  Result Value Ref Range   Squamous Epithelial / LPF MANY (A) RARE   WBC, UA TOO NUMEROUS TO COUNT <3 WBC/hpf   RBC / HPF 7-10 <3 RBC/hpf   Bacteria, UA RARE RARE   Urine-Other MUCOUS PRESENT   Wet prep, genital     Status: Abnormal   Collection Time: 09/08/14 10:30 AM  Result Value Ref Range   Yeast Wet Prep HPF POC NONE SEEN NONE SEEN   Trich, Wet Prep NONE SEEN NONE SEEN   Clue Cells Wet Prep HPF POC NONE SEEN NONE SEEN   WBC, Wet Prep HPF POC MANY (A) NONE SEEN     MAU Course   Procedures  MDM TOCO: rate 145, reassuring with few contractions Urine culture, wet prep Tylenol for discomfort Discussed case with Dr Dareen PianoAnderson who advised treating UTI/ rest/ tylenol and follow up in office as needed Upon discharge pt was complaining of contractions that are painful/ rechecked cervix with no increase in dilation Will give percocet and recheck in 30 min. Bleeding scant Recheck/ patient is doing much better/ ready to go home/ few contractions Assessment and Plan   A: UTI Vaginal bleeding  P: Macrobid 100 mg BID x 7 days Increase fluids/ pelvic rest/ tylenol If bleeding changes or worsens call Dr Dareen PianoAnderson or return to MAU  Stacy Cole, Stacy Cole 09/08/2014, 11:17 AM

## 2014-09-09 ENCOUNTER — Encounter (HOSPITAL_COMMUNITY): Payer: Self-pay | Admitting: *Deleted

## 2014-09-09 ENCOUNTER — Inpatient Hospital Stay (HOSPITAL_COMMUNITY)
Admission: AD | Admit: 2014-09-09 | Discharge: 2014-09-12 | DRG: 774 | Disposition: A | Discharge status: Home / Self Care | Payer: Medicaid Other | Source: Ambulatory Visit | Attending: Obstetrics and Gynecology | Admitting: Obstetrics and Gynecology

## 2014-09-09 DIAGNOSIS — O24419 Gestational diabetes mellitus in pregnancy, unspecified control: Principal | ICD-10-CM | POA: Diagnosis present

## 2014-09-09 DIAGNOSIS — Z3483 Encounter for supervision of other normal pregnancy, third trimester: Secondary | ICD-10-CM | POA: Diagnosis present

## 2014-09-09 DIAGNOSIS — O09523 Supervision of elderly multigravida, third trimester: Secondary | ICD-10-CM | POA: Diagnosis not present

## 2014-09-09 DIAGNOSIS — Z3A38 38 weeks gestation of pregnancy: Secondary | ICD-10-CM | POA: Diagnosis present

## 2014-09-09 DIAGNOSIS — Z3480 Encounter for supervision of other normal pregnancy, unspecified trimester: Secondary | ICD-10-CM

## 2014-09-09 LAB — GLUCOSE, CAPILLARY: Glucose-Capillary: 76 mg/dL (ref 70–99)

## 2014-09-09 LAB — CBC
HCT: 34.1 % — ABNORMAL LOW (ref 36.0–46.0)
Hemoglobin: 11.3 g/dL — ABNORMAL LOW (ref 12.0–15.0)
MCH: 30.5 pg (ref 26.0–34.0)
MCHC: 33.1 g/dL (ref 30.0–36.0)
MCV: 92.2 fL (ref 78.0–100.0)
Platelets: 164 10*3/uL (ref 150–400)
RBC: 3.7 MIL/uL — ABNORMAL LOW (ref 3.87–5.11)
RDW: 14 % (ref 11.5–15.5)
WBC: 6.1 10*3/uL (ref 4.0–10.5)

## 2014-09-09 MED ORDER — LACTATED RINGERS IV SOLN
500.0000 mL | INTRAVENOUS | Status: DC | PRN
  Administered 2014-09-10: 500 mL via INTRAVENOUS

## 2014-09-09 MED ORDER — ONDANSETRON HCL 4 MG/2ML IJ SOLN
4.0000 mg | Freq: Four times a day (QID) | INTRAMUSCULAR | Status: DC | PRN
  Administered 2014-09-10: 4 mg via INTRAVENOUS
  Filled 2014-09-09: qty 2

## 2014-09-09 MED ORDER — OXYCODONE-ACETAMINOPHEN 5-325 MG PO TABS
2.0000 | ORAL_TABLET | ORAL | Status: DC | PRN
  Administered 2014-09-10: 2 via ORAL
  Filled 2014-09-09: qty 2

## 2014-09-09 MED ORDER — PENICILLIN G POTASSIUM 5000000 UNITS IJ SOLR
2.5000 10*6.[IU] | INTRAVENOUS | Status: DC
  Administered 2014-09-09 – 2014-09-10 (×3): 2.5 10*6.[IU] via INTRAVENOUS
  Filled 2014-09-09 (×6): qty 2.5

## 2014-09-09 MED ORDER — CITRIC ACID-SODIUM CITRATE 334-500 MG/5ML PO SOLN
30.0000 mL | ORAL | Status: DC | PRN
Start: 2014-09-09 — End: 2014-09-10

## 2014-09-09 MED ORDER — LIDOCAINE HCL (PF) 1 % IJ SOLN
30.0000 mL | INTRAMUSCULAR | Status: DC | PRN
  Filled 2014-09-09: qty 30

## 2014-09-09 MED ORDER — OXYTOCIN 40 UNITS IN LACTATED RINGERS INFUSION - SIMPLE MED
1.0000 m[IU]/min | INTRAVENOUS | Status: DC
  Administered 2014-09-09: 2 m[IU]/min via INTRAVENOUS

## 2014-09-09 MED ORDER — ACETAMINOPHEN 325 MG PO TABS: 650.0000 mg | ORAL_TABLET | ORAL | Status: DC | PRN

## 2014-09-09 MED ORDER — OXYTOCIN BOLUS FROM INFUSION: 500.0000 mL | INTRAVENOUS | Status: DC

## 2014-09-09 MED ORDER — LACTATED RINGERS IV SOLN
INTRAVENOUS | Status: DC
  Administered 2014-09-09 – 2014-09-10 (×2): via INTRAVENOUS
  Administered 2014-09-10: 75 mL/h via INTRAVENOUS

## 2014-09-09 MED ORDER — OXYTOCIN 40 UNITS IN LACTATED RINGERS INFUSION - SIMPLE MED
62.5000 mL/h | INTRAVENOUS | Status: DC
  Administered 2014-09-10: 62.5 mL/h via INTRAVENOUS
  Filled 2014-09-09 (×2): qty 1000

## 2014-09-09 MED ORDER — TERBUTALINE SULFATE 1 MG/ML IJ SOLN: 0.2500 mg | Freq: Once | INTRAMUSCULAR | Status: AC | PRN

## 2014-09-09 MED ORDER — FLEET ENEMA 7-19 GM/118ML RE ENEM
1.0000 | ENEMA | RECTAL | Status: DC | PRN
Start: 2014-09-09 — End: 2014-09-10

## 2014-09-09 MED ORDER — OXYCODONE-ACETAMINOPHEN 5-325 MG PO TABS: 1.0000 | ORAL_TABLET | ORAL | Status: DC | PRN

## 2014-09-09 MED ORDER — PENICILLIN G POTASSIUM 5000000 UNITS IJ SOLR
5.0000 10*6.[IU] | Freq: Once | INTRAVENOUS | Status: AC
  Administered 2014-09-09: 5 10*6.[IU] via INTRAVENOUS
  Filled 2014-09-09: qty 5

## 2014-09-09 NOTE — H&P (Signed)
Pt is a 39 y/o black female G4 P3003 at term who presents to the ER with PROM. PNC was complicated by AMA and GDM. Pt declined any genetic testing for AMA. She has had good glucose control. PMHX: see prenatal record PE: VSSAF         HEENT-wnl         ABD-gravid         FHTs reactive. IMP/IUP at term with PROM.         AMA        GDM PLAN/ Admit

## 2014-09-09 NOTE — MAU Note (Signed)
Pt presents to MAU via EMS with complaints of rupture of membranes at 345. Denies any vaginal bleeding or contractions

## 2014-09-10 ENCOUNTER — Encounter (HOSPITAL_COMMUNITY): Payer: Self-pay | Admitting: *Deleted

## 2014-09-10 ENCOUNTER — Encounter (HOSPITAL_COMMUNITY): Payer: Self-pay | Admitting: Anesthesiology

## 2014-09-10 ENCOUNTER — Inpatient Hospital Stay (HOSPITAL_COMMUNITY): Payer: Medicaid Other | Admitting: Anesthesiology

## 2014-09-10 DIAGNOSIS — Z3480 Encounter for supervision of other normal pregnancy, unspecified trimester: Secondary | ICD-10-CM

## 2014-09-10 LAB — COMPREHENSIVE METABOLIC PANEL
ALT: 10 U/L (ref 0–35)
ANION GAP: 8 (ref 5–15)
AST: 23 U/L (ref 0–37)
Albumin: 2.8 g/dL — ABNORMAL LOW (ref 3.5–5.2)
Alkaline Phosphatase: 125 U/L — ABNORMAL HIGH (ref 39–117)
BILIRUBIN TOTAL: 1 mg/dL (ref 0.3–1.2)
BUN: <5 mg/dL — ABNORMAL LOW (ref 6–23)
CALCIUM: 8.3 mg/dL — AB (ref 8.4–10.5)
CHLORIDE: 107 meq/L (ref 96–112)
CO2: 22 mmol/L (ref 19–32)
Creatinine, Ser: 0.55 mg/dL (ref 0.50–1.10)
GFR calc Af Amer: >90 mL/min (ref >90)
GLUCOSE: 118 mg/dL — AB (ref 70–99)
POTASSIUM: 3.6 mmol/L (ref 3.5–5.1)
SODIUM: 137 mmol/L (ref 135–145)
Total Protein: 5.5 g/dL — ABNORMAL LOW (ref 6.0–8.3)

## 2014-09-10 LAB — CBC
HCT: 32.1 % — ABNORMAL LOW (ref 36.0–46.0)
Hemoglobin: 10.8 g/dL — ABNORMAL LOW (ref 12.0–15.0)
MCH: 30.7 pg (ref 26.0–34.0)
MCHC: 33.6 g/dL (ref 30.0–36.0)
MCV: 91.2 fL (ref 78.0–100.0)
PLATELETS: 144 10*3/uL — AB (ref 150–400)
RBC: 3.52 MIL/uL — ABNORMAL LOW (ref 3.87–5.11)
RDW: 13.8 % (ref 11.5–15.5)
WBC: 11 10*3/uL — AB (ref 4.0–10.5)

## 2014-09-10 LAB — CULTURE, OB URINE
Colony Count: 85000
SPECIAL REQUESTS: NORMAL

## 2014-09-10 LAB — PREPARE RBC (CROSSMATCH)

## 2014-09-10 MED ORDER — EPHEDRINE 5 MG/ML INJ
10.0000 mg | INTRAVENOUS | Status: DC | PRN
  Filled 2014-09-10: qty 2

## 2014-09-10 MED ORDER — OXYCODONE-ACETAMINOPHEN 5-325 MG PO TABS
1.0000 | ORAL_TABLET | ORAL | Status: DC | PRN
  Administered 2014-09-10 – 2014-09-11 (×2): 1 via ORAL
  Filled 2014-09-10 (×2): qty 1

## 2014-09-10 MED ORDER — FENTANYL 2.5 MCG/ML BUPIVACAINE 1/10 % EPIDURAL INFUSION (WH - ANES)
14.0000 mL/h | INTRAMUSCULAR | Status: DC | PRN
  Administered 2014-09-10: 14 mL/h via EPIDURAL
  Filled 2014-09-10: qty 125

## 2014-09-10 MED ORDER — LACTATED RINGERS IV SOLN: 500.0000 mL | Freq: Once | INTRAVENOUS | Status: DC

## 2014-09-10 MED ORDER — CARBOPROST TROMETHAMINE 250 MCG/ML IM SOLN
INTRAMUSCULAR | Status: AC
  Administered 2014-09-10: 250 ug via INTRAMUSCULAR
  Filled 2014-09-10: qty 1

## 2014-09-10 MED ORDER — ZOLPIDEM TARTRATE 5 MG PO TABS: 5.0000 mg | ORAL_TABLET | Freq: Every evening | ORAL | Status: DC | PRN

## 2014-09-10 MED ORDER — MEASLES, MUMPS & RUBELLA VAC ~~LOC~~ INJ
0.5000 mL | INJECTION | Freq: Once | SUBCUTANEOUS | Status: DC
  Filled 2014-09-10: qty 0.5

## 2014-09-10 MED ORDER — TETANUS-DIPHTH-ACELL PERTUSSIS 5-2.5-18.5 LF-MCG/0.5 IM SUSP
0.5000 mL | Freq: Once | INTRAMUSCULAR | Status: AC
  Administered 2014-09-12: 0.5 mL via INTRAMUSCULAR

## 2014-09-10 MED ORDER — ONDANSETRON HCL 4 MG PO TABS: 4.0000 mg | ORAL_TABLET | ORAL | Status: DC | PRN

## 2014-09-10 MED ORDER — FENTANYL 2.5 MCG/ML BUPIVACAINE 1/10 % EPIDURAL INFUSION (WH - ANES)
INTRAMUSCULAR | Status: AC
  Filled 2014-09-10: qty 125

## 2014-09-10 MED ORDER — CARBOPROST TROMETHAMINE 250 MCG/ML IM SOLN
250.0000 ug | INTRAMUSCULAR | Status: DC | PRN
  Administered 2014-09-10: 250 ug via INTRAMUSCULAR

## 2014-09-10 MED ORDER — DIBUCAINE 1 % RE OINT
1.0000 "application " | TOPICAL_OINTMENT | RECTAL | Status: DC | PRN
  Administered 2014-09-11: 1 via RECTAL
  Filled 2014-09-10: qty 28

## 2014-09-10 MED ORDER — SENNOSIDES-DOCUSATE SODIUM 8.6-50 MG PO TABS
2.0000 | ORAL_TABLET | ORAL | Status: DC
  Administered 2014-09-10 – 2014-09-11 (×2): 2 via ORAL
  Filled 2014-09-10 (×2): qty 2

## 2014-09-10 MED ORDER — BENZOCAINE-MENTHOL 20-0.5 % EX AERO
1.0000 "application " | INHALATION_SPRAY | CUTANEOUS | Status: DC | PRN
  Administered 2014-09-10: 1 via TOPICAL
  Filled 2014-09-10: qty 56

## 2014-09-10 MED ORDER — DIPHENOXYLATE-ATROPINE 2.5-0.025 MG PO TABS
2.0000 | ORAL_TABLET | Freq: Three times a day (TID) | ORAL | Status: DC | PRN
  Administered 2014-09-10: 2 via ORAL
  Filled 2014-09-10: qty 2

## 2014-09-10 MED ORDER — PHENYLEPHRINE 40 MCG/ML (10ML) SYRINGE FOR IV PUSH (FOR BLOOD PRESSURE SUPPORT)
80.0000 ug | PREFILLED_SYRINGE | INTRAVENOUS | Status: DC | PRN
  Filled 2014-09-10: qty 2

## 2014-09-10 MED ORDER — SODIUM BICARBONATE 8.4 % IV SOLN
INTRAVENOUS | Status: DC | PRN
  Administered 2014-09-10 (×2): 5 mL via EPIDURAL

## 2014-09-10 MED ORDER — OXYCODONE-ACETAMINOPHEN 5-325 MG PO TABS
2.0000 | ORAL_TABLET | ORAL | Status: DC | PRN
  Administered 2014-09-12: 2 via ORAL
  Filled 2014-09-10: qty 2

## 2014-09-10 MED ORDER — LIDOCAINE HCL (PF) 1 % IJ SOLN
INTRAMUSCULAR | Status: DC | PRN
  Administered 2014-09-10: 3 mL
  Administered 2014-09-10 (×2): 5 mL

## 2014-09-10 MED ORDER — MISOPROSTOL 200 MCG PO TABS
ORAL_TABLET | ORAL | Status: AC
  Filled 2014-09-10: qty 5

## 2014-09-10 MED ORDER — SIMETHICONE 80 MG PO CHEW: 80.0000 mg | CHEWABLE_TABLET | ORAL | Status: DC | PRN

## 2014-09-10 MED ORDER — DIPHENHYDRAMINE HCL 50 MG/ML IJ SOLN: 12.5000 mg | INTRAMUSCULAR | Status: DC | PRN

## 2014-09-10 MED ORDER — MISOPROSTOL 200 MCG PO TABS
1000.0000 ug | ORAL_TABLET | Freq: Once | ORAL | Status: AC
Start: 2014-09-10 — End: 2014-09-10
  Administered 2014-09-10: 1000 ug via RECTAL

## 2014-09-10 MED ORDER — PHENYLEPHRINE 40 MCG/ML (10ML) SYRINGE FOR IV PUSH (FOR BLOOD PRESSURE SUPPORT)
PREFILLED_SYRINGE | INTRAVENOUS | Status: AC
  Filled 2014-09-10: qty 20

## 2014-09-10 MED ORDER — WITCH HAZEL-GLYCERIN EX PADS
1.0000 "application " | MEDICATED_PAD | CUTANEOUS | Status: DC | PRN
  Administered 2014-09-11: 1 via TOPICAL

## 2014-09-10 MED ORDER — ONDANSETRON HCL 4 MG/2ML IJ SOLN: 4.0000 mg | INTRAMUSCULAR | Status: DC | PRN

## 2014-09-10 MED ORDER — FENTANYL 2.5 MCG/ML BUPIVACAINE 1/10 % EPIDURAL INFUSION (WH - ANES)
INTRAMUSCULAR | Status: DC | PRN
  Administered 2014-09-10: 14 mL/h via EPIDURAL

## 2014-09-10 MED ORDER — IBUPROFEN 600 MG PO TABS
600.0000 mg | ORAL_TABLET | Freq: Four times a day (QID) | ORAL | Status: DC
  Administered 2014-09-10 – 2014-09-12 (×9): 600 mg via ORAL
  Filled 2014-09-10 (×9): qty 1

## 2014-09-10 NOTE — Progress Notes (Signed)
[place], MD, notified that pt was a PPH and RN calling to verify order to make sure it is ok to remove epidural catheter [facility] MD aware of platelet count of 144 taken at 0955. RN to remove epidural catheter.

## 2014-09-10 NOTE — Progress Notes (Signed)
This note also relates to the following rows which could not be included: Pulse Rate - Cannot attach notes to unvalidated device data SpO2 - Cannot attach notes to unvalidated device data   Faculty backup for delivery at bedside.

## 2014-09-10 NOTE — Progress Notes (Signed)
Called by RN approx 2 hrs after delivery for heavy bleeding.  RN estimated approx 700cc total including loss at time of delivery.  Pt received rectal cytotec and 1 dose of hemabate.  i examined her and found fundus firm and 3cm below umbilicus.  Was informed an hour later that bleeding was reduced.  I Advised that foley could be removed and pt transferred to pp.  Vital signs remained stable throughout.  Hb >10.  Will recheck in am.

## 2014-09-10 NOTE — Progress Notes (Signed)
This note also relates to the following rows which could not be included: SpO2 - Cannot attach notes to unvalidated device data  Anesthesia notified of no relief of pt's pain despite several times pushing pca.  Orders to call CRNA for bolus.  Crna called and is coming to room

## 2014-09-10 NOTE — Progress Notes (Signed)
PPH called, RRT responded within minutes.  CRNA at bedside. Medication already given, Fluid Bolus infusing.  Patient alert and oriented.  Vital signs stable.  MD arrived to beside.  Bleeding is minimal at this time.  WIll draw labs and continue to monitor.  PPH complete for RRT.

## 2014-09-10 NOTE — Anesthesia Postprocedure Evaluation (Signed)
  Anesthesia Post-op Note  Patient: Stacy Cole  Procedure(s) Performed: * No procedures listed *  Patient Location: PACU and Mother/Baby  Anesthesia Type:Epidural  Level of Consciousness: awake, alert , oriented and patient cooperative  Airway and Oxygen Therapy: Patient Spontanous Breathing  Post-op Pain: none  Post-op Assessment: Post-op Vital signs reviewed, Patient's Cardiovascular Status Stable, Respiratory Function Stable, Patent Airway, No signs of Nausea or vomiting, Adequate PO intake, Pain level controlled, No headache, No backache, No residual numbness and No residual motor weakness  Post-op Vital Signs: Reviewed  Last Vitals:  Filed Vitals:   09/10/14 1300  BP: 138/71  Pulse: 77  Temp: 36.4 C  Resp: 18    Complications: No apparent anesthesia complications

## 2014-09-10 NOTE — Anesthesia Preprocedure Evaluation (Signed)
Anesthesia Evaluation  Patient identified by MRN, date of birth, ID band Patient awake    Reviewed: Allergy & Precautions, H&P , NPO status , Patient's Chart, lab work & pertinent test results  History of Anesthesia Complications Negative for: history of anesthetic complications  Airway Mallampati: II TM Distance: >3 FB Neck ROM: full    Dental no notable dental hx. (+) Teeth Intact   Pulmonary neg pulmonary ROS,  breath sounds clear to auscultation  Pulmonary exam normal       Cardiovascular negative cardio ROS  Rhythm:regular Rate:Normal     Neuro/Psych negative neurological ROS  negative psych ROS   GI/Hepatic negative GI ROS, Neg liver ROS,   Endo/Other  negative endocrine ROSdiabetes  Renal/GU negative Renal ROS  negative genitourinary   Musculoskeletal   Abdominal Normal abdominal exam  (+)   Peds  Hematology negative hematology ROS (+)   Anesthesia Other Findings   Reproductive/Obstetrics (+) Pregnancy                           Anesthesia Physical Anesthesia Plan  ASA: II  Anesthesia Plan: Epidural   Post-op Pain Management:    Induction:   Airway Management Planned:   Additional Equipment:   Intra-op Plan:   Post-operative Plan:   Informed Consent: I have reviewed the patients History and Physical, chart, labs and discussed the procedure including the risks, benefits and alternatives for the proposed anesthesia with the patient or authorized representative who has indicated his/her understanding and acceptance.     Plan Discussed with:   Anesthesia Plan Comments:         Anesthesia Quick Evaluation  

## 2014-09-10 NOTE — Progress Notes (Signed)
Code PPH called. Pt now Stage 2 based on EBL.

## 2014-09-10 NOTE — Progress Notes (Addendum)
Claiborne Billingsallahan, MD, notified of patients bleeding. Notified of delivery time of [phone] with initial EBL of 400.  Aware of fundal assessment at 0730 that revealed fundus 2 above umbilicus with small clots and approx 5-6 cm area of blood on peripad. In & out catheter performed at 0740 with 400 ml of urine and fundus firm at umbilicus afterwards. Fundal assessment at 0805 produced many clots and peripad became more saturated. At 0810 peripad was weighed and revealed an additional 450 ml of blood loss. Notified of patient's VS - some increased BP's. Orders received for cytotec, hemabate, foley catheter , type and cross 2 units and [facility].

## 2014-09-10 NOTE — Anesthesia Procedure Notes (Signed)
Epidural Patient location during procedure: OB  Staffing Anesthesiologist: Anacleto Batterman Performed by: anesthesiologist   Preanesthetic Checklist Completed: patient identified, site marked, surgical consent, pre-op evaluation, timeout performed, IV checked, risks and benefits discussed and monitors and equipment checked  Epidural Patient position: sitting Prep: ChloraPrep Patient monitoring: heart rate, continuous pulse ox and blood pressure Approach: right paramedian Location: L3-L4 Injection technique: LOR saline  Needle:  Needle type: Tuohy  Needle gauge: 17 G Needle length: 9 cm and 9 Needle insertion depth: 6 cm Catheter type: closed end flexible Catheter size: 20 Guage Catheter at skin depth: 11 cm Test dose: negative  Assessment Events: blood not aspirated, injection not painful, no injection resistance, negative IV test and no paresthesia  Additional Notes   Patient tolerated the insertion well without complications.   

## 2014-09-10 NOTE — Progress Notes (Signed)
This note also relates to the following rows which could not be included: Pulse Rate - Cannot attach notes to unvalidated device data SpO2 - Cannot attach notes to unvalidated device data   Pt wakes from sleep moaning and hurting.  PCA button pushed.  Will continue to monitor pain.

## 2014-09-10 NOTE — Progress Notes (Signed)
Jean RosenthalJackson, MD, notified of stage-1 PPH. Orders received to insert second IV.

## 2014-09-10 NOTE — Progress Notes (Signed)
Pt called out reporting need to have a bowel movement. VS stable. Pt reports no dizziness, weakness or nausea. Steady used to transport pt to bathroom. Pad weighed by another RN and additional 110 ml of blood loss noted. Stage-2 PPH to be called.

## 2014-09-10 NOTE — Progress Notes (Signed)
Claiborne Billingsallahan, MD, at bedside to assess pt and bleeding. Notified of total EBL, 2nd IV line per anesthesia, urine output and VS. Orders received for CBC and CMET. [facility] may remove foley catheter and saline lock IV's. RN to continue to monitor at this time and call MD [facility].

## 2014-09-10 NOTE — Anesthesia Post-op Follow-up Note (Signed)
Pt states pain 10/10. Placed in supine position. Bolus 5ml in 5 minute increments. Pt with decrease in pain less than 5. Vs stable

## 2014-09-11 ENCOUNTER — Inpatient Hospital Stay (HOSPITAL_COMMUNITY): Admission: RE | Admit: 2014-09-11 | Payer: Managed Care, Other (non HMO) | Source: Ambulatory Visit

## 2014-09-11 LAB — RPR: RPR Ser Ql: NONREACTIVE

## 2014-09-11 NOTE — Progress Notes (Signed)
UR chart review completed.  

## 2014-09-11 NOTE — Progress Notes (Signed)
MOB was referred for history of depression/anxiety.  Referral is screened out by Clinical Social Worker because none of the following criteria appear to apply: -History of anxiety/depression during this pregnancy, or of post-partum depression. - Diagnosis of anxiety and/or depression within last 3 years - History of depression due to pregnancy loss/loss of child or -MOB's symptoms are currently being treated with medication and/or therapy.  CSW completed chart review and noted that MOB received diagnosis more than 3 years ago.  No acute symptoms reported at this time.  Please contact the Clinical Social Worker if needs arise or upon MOB request.

## 2014-09-11 NOTE — Progress Notes (Signed)
Post Partum Day 1 Subjective: no complaints, up ad lib, voiding, tolerating PO, + flatus and breast feeding  Objective: Blood pressure 124/84, pulse 77, temperature 97.9 F (36.6 C), temperature source Oral, resp. rate 18, height 5\' 7"  (1.702 m), weight 99.338 kg (219 lb), last menstrual period 12/13/2013, SpO2 100 %, unknown if currently breastfeeding.  Physical Exam:  General: alert, cooperative and no distress Lochia: appropriate Uterine Fundus: firm at U DVT Evaluation: No evidence of DVT seen on physical exam. Negative Homan's sign. No cords or calf tenderness.   Recent Labs  09/09/14 1655 09/10/14 0955  HGB 11.3* 10.8*  HCT 34.1* 32.1*    Assessment/Plan: Plan for discharge tomorrow and Breastfeeding Patient is s/p PPH doing well no significant bleeding, she is hemodynamically stable   LOS: 2 days   Stacy Cole Stacy Cole 09/11/2014, 10:40 AM

## 2014-09-12 ENCOUNTER — Inpatient Hospital Stay (HOSPITAL_COMMUNITY): Admission: RE | Admit: 2014-09-12 | Payer: Managed Care, Other (non HMO) | Source: Ambulatory Visit

## 2014-09-12 MED ORDER — IBUPROFEN 600 MG PO TABS: 600.0000 mg | ORAL_TABLET | Freq: Four times a day (QID) | ORAL | Status: AC | PRN

## 2014-09-12 NOTE — Discharge Summary (Signed)
  Obstetric Discharge Summary Reason for Admission: rupture of membranes Prenatal Procedures: none Intrapartum Procedures: spontaneous vaginal delivery Postpartum Procedures: none Complications-Operative and Postpartum: hemorrhage HEMOGLOBIN  Date Value Ref Range Status  09/10/2014 10.8* 12.0 - 15.0 g/dL Final   HCT  Date Value Ref Range Status  09/10/2014 32.1* 36.0 - 46.0 % Final    Physical Exam:  General: alert and cooperative Lochia: appropriate Uterine Fundus: firm DVT Evaluation: No evidence of DVT seen on physical exam.  Discharge Diagnoses: Term Pregnancy-delivered  Discharge Information: Date: 09/12/2014 Activity: pelvic rest Diet: routine Medications: PNV, Ibuprofen and Iron Condition: stable Instructions: refer to practice specific booklet Discharge to: home Follow-up Information    Follow up with Levi AlandANDERSON,MARK E, MD In 4 months.   Specialty:  Obstetrics and Gynecology   Contact information:   [address] [place] [id number] [phone]       Newborn Data: Live born female  Birth Weight: 7 lb 14.5 oz (3585 g) APGAR: 8, 9  Baby in NICU, jaundice  Marlow BaarsCLARK, Brentton Wardlow 09/12/2014, 9:32 AM

## 2014-09-12 NOTE — Lactation Note (Signed)
This note was copied from the chart of Stacy Cole. Lactation Consultation Note  Patient Name: Stacy Catia Francesconi Today's Date: 09/12/2014 Reason for consult: Initial assessment;NICU baby  Visited with Mom on day of discharge, baby at 2351 hrs old.  Mom had been exclusively bottle feeding, until last night when she latched baby twice.  Baby had been under phototherapy for + DAT at bedside, but was transferred to NICU this am.  Bilirubin increased to 18.  Mom wishes to provide breast milk to baby while she is in NICU, and she plans to breast feed at home.  Bottles and baby name labels obtained for expressed breast milk. Set up DEBP, and assisted Mom with pumping for the first time.  Explained the routine and care of pump and pump parts.  Mom has WIC, so faxed a referral to them for a pump after discharge.  Explained to Mom how our pump loaner program works.  Told Mom about pump rooms in the NICU.  Explained importance of breast massage, and manual breast expression.  To follow up as needed with Mom during baby's stay in the NICU.    Lactation Tools Discussed/Used WIC Program: Yes Pump Review: Setup, frequency, and cleaning;Milk Storage Initiated by:: Johny Blameraroline Montague Corella RN IBCLC Date initiated:: 09/12/14   Consult Status Consult Status: Follow-up Date: 09/12/14 Follow-up type: Call as needed    Judee ClaraSmith, Meelah Tallo E 09/12/2014, 10:29 AM

## 2014-09-12 NOTE — Discharge Instructions (Signed)

## 2014-09-12 NOTE — Progress Notes (Signed)
Patient is doing well.  She is ambulating, voiding, tolerating PO.  Pain control is good.  Lochia is appropriate.    Filed Vitals:   09/10/14 1700 09/11/14 0609 09/11/14 1826 09/12/14 0532  BP: 139/87 124/84 133/75 124/81  Pulse: 80 77 83 74  Temp:  97.9 F (36.6 C) 98.2 F (36.8 C) 98.4 F (36.9 C)  TempSrc:  Oral Oral Oral  Resp: 18   18  Height:      Weight:      SpO2:  100% 99%     NAD Fundus firm Ext: trace edema b/l  Lab Results  Component Value Date   WBC 11.0* 09/10/2014   HGB 10.8* 09/10/2014   HCT 32.1* 09/10/2014   MCV 91.2 09/10/2014   PLT 144* 09/10/2014    --/--/O POS (01/10 1655)/RImmune  A/P 38 y.o. [id number] PPD#2. Routine care.   Meeting all goals, d/c today Baby to NICU for jaundice GDMA2.  Fastings ok.  Plan 2hr gtt at 6 weeks.   [place]assa[place]ARK, [facility] CenterDYANNA

## 2014-09-13 ENCOUNTER — Ambulatory Visit: Payer: Self-pay

## 2014-09-13 LAB — TYPE AND SCREEN
ABO/RH(D): O POS
Antibody Screen: NEGATIVE
UNIT DIVISION: 0
UNIT DIVISION: 0

## 2014-09-13 NOTE — Lactation Note (Signed)
This note was copied from the chart of Stacy Cole. Lactation Consultation Note  Met mom in NICU to assist her with pumping.  Reviewed setting pump up, function and cleaning. Mom has not heard from [facility].  Breasts becoming full but not engorged.  Mom pumped for 15 minutes and obtained 50 mls total.  Reviewed supply and demand and stressed importance of pumping every 3 hours x 15-20 minutes.  Mom would like to get a [facility] loaner here today.  Pump loaner completed.  Patient Name: Stacy Cole Today's Date: 09/13/2014     Maternal Data    Feeding    LATCH Score/Interventions                      Lactation Tools Discussed/Used     Consult Status      Ave Filter 09/13/2014, 11:55 AM

## 2014-09-13 NOTE — Anesthesia Postprocedure Evaluation (Signed)
  Anesthesia Post Note  Patient: Stacy Cole  Procedure(s) Performed: * No procedures listed *  Anesthesia type: Epidural  Patient location: Mother/Baby  Post pain: Pain level controlled  Post assessment: Post-op Vital signs reviewed  Last Vitals: There were no vitals filed for this visit.  Post vital signs: Reviewed  Level of consciousness: awake  Complications: No apparent anesthesia complications

## 2014-09-16 ENCOUNTER — Inpatient Hospital Stay (HOSPITAL_COMMUNITY)
Admission: AD | Admit: 2014-09-16 | Discharge: 2014-09-16 | Disposition: A | Discharge status: Home / Self Care | Payer: Medicaid Other | Source: Ambulatory Visit | Attending: Obstetrics & Gynecology | Admitting: Obstetrics & Gynecology

## 2014-09-16 ENCOUNTER — Encounter (HOSPITAL_COMMUNITY): Payer: Self-pay | Admitting: *Deleted

## 2014-09-16 DIAGNOSIS — M545 Low back pain: Secondary | ICD-10-CM | POA: Diagnosis present

## 2014-09-16 DIAGNOSIS — O9089 Other complications of the puerperium, not elsewhere classified: Secondary | ICD-10-CM | POA: Diagnosis not present

## 2014-09-16 DIAGNOSIS — R1032 Left lower quadrant pain: Secondary | ICD-10-CM | POA: Insufficient documentation

## 2014-09-16 LAB — URINE MICROSCOPIC-ADD ON

## 2014-09-16 LAB — URINALYSIS, ROUTINE W REFLEX MICROSCOPIC
BILIRUBIN URINE: NEGATIVE
Glucose, UA: NEGATIVE mg/dL
Ketones, ur: NEGATIVE mg/dL
LEUKOCYTES UA: NEGATIVE
NITRITE: NEGATIVE
PROTEIN: 30 mg/dL — AB
Specific Gravity, Urine: 1.01 (ref 1.005–1.030)
UROBILINOGEN UA: 0.2 mg/dL (ref 0.0–1.0)
pH: 7 (ref 5.0–8.0)

## 2014-09-16 NOTE — MAU Note (Signed)
[facility] days [facility]part[facility] vaginal delivery with complaint of lower left groin pain and left back pain.

## 2014-09-16 NOTE — MAU Provider Note (Signed)
History  Chief Complaint:  Abdominal Pain and Back Pain  Teriann Sciarra is a 39 y.o. [id number] female at 6d postpartum presenting with complaint of lower back pain & left lower quadrant pain that began around midnight.  Unrelieved by Ibuprofen. Denies uti s/s, n/v/c/d, fever, or recent illness.  Last BM yesterday. Reports scant lochia, no clots.  Obstetrical History: OB History    Gravida Para Term Preterm AB TAB SAB Ectopic Multiple Living   4 4 4  0 0 0 0 0 0 4      Past Medical History: Past Medical History  Diagnosis Date  . Depression   . Chicken pox   . AMA (advanced maternal age) multigravida 35+   . Gestational diabetes     glyburide    Past Surgical History: Past Surgical History  Procedure Laterality Date  . No past surgeries      Social History: History   Social History  . Marital Status: Married    Spouse Name: N/A    Number of Children: N/A  . Years of Education: N/A   Social History Main Topics  . Smoking status: Never Smoker   . Smokeless tobacco: Never Used  . Alcohol Use: No  . Drug Use: No  . Sexual Activity: Yes    Birth Control/ Protection: None   Other Topics Concern  . None   Social History Narrative    Allergies: No Known Allergies  Prescriptions prior to admission  Medication Sig Dispense Refill Last Dose  . ibuprofen (ADVIL,MOTRIN) 600 MG tablet Take 1 tablet (600 mg total) by mouth every 6 (six) hours as needed for mild pain or cramping. 30 tablet 0 09/16/2014 at Unknown time  . Iron-FA-B Cmp-C-Biot-Probiotic (FUSION PLUS) CAPS Take 1 capsule by mouth 2 (two) times daily.   6 09/15/2014 at Unknown time  . prenatal vitamin w/FE, FA (PRENATAL 1 + 1) 27-1 MG TABS Take 1 tablet by mouth daily.   09/15/2014 at Unknown time    Review of Systems  Pertinent pos/neg as indicated in HPI  Physical Exam  Blood pressure 127/88, pulse 64, temperature 98.3 F (36.8 C), temperature source Oral, resp. rate 16, height 5\' 7"  (1.702 m), weight 99.338  kg (219 lb), last menstrual period 12/13/2013, unknown if currently breastfeeding. General appearance: alert, cooperative and mild distress Lungs: clear to auscultation bilaterally, normal effort Heart: regular rate and rhythm Abdomen: soft, with left lower quadrant tenderness CVA tenderness, left side Extremities: no edema    Cultures/Specimens: Urinalysis   MAU Course   Labs:  Results for orders placed or performed during the hospital encounter of 09/16/14 (from the past 24 hour(s))  Urinalysis, Routine w reflex microscopic     Status: Abnormal   Collection Time: 09/16/14 11:08 AM  Result Value Ref Range   Color, Urine YELLOW YELLOW   APPearance CLEAR CLEAR   Specific Gravity, Urine 1.010 1.005 - 1.030   pH 7.0 5.0 - 8.0   Glucose, UA NEGATIVE NEGATIVE mg/dL   Hgb urine dipstick LARGE (A) NEGATIVE   Bilirubin Urine NEGATIVE NEGATIVE   Ketones, ur NEGATIVE NEGATIVE mg/dL   Protein, ur 30 (A) NEGATIVE mg/dL   Urobilinogen, UA 0.2 0.0 - 1.0 mg/dL   Nitrite NEGATIVE NEGATIVE   Leukocytes, UA NEGATIVE NEGATIVE  Urine microscopic-add on     Status: Abnormal   Collection Time: 09/16/14 11:08 AM  Result Value Ref Range   Squamous Epithelial / LPF FEW (A) RARE   WBC, UA 3-6 <3 WBC/hpf  RBC / HPF 11-20 <3 RBC/hpf   Bacteria, UA FEW (A) RARE    Assessment and Plan  A:  [id number], 6 d postpartum  Lower back & left lower quadrant pain, unclear etiology  U/A neg for UTI  P:  Reports pain resolved now  Discharge to home  May take OTC Colace bid  If symptoms return, follow-up with PCP    Felton Clinton SNM 1/17/201612:11 PM

## 2014-09-16 NOTE — Discharge Instructions (Signed)
Abdominal Pain Many things can cause belly (abdominal) pain. Most times, the belly pain is not dangerous. Many cases of belly pain can be watched and treated at home. HOME CARE   You may take Colace two times each day, as needed for constipation.  Only take medicine as told by your doctor.  Eat or drink as told by your doctor. Your doctor will tell you if you should be on a special diet. GET HELP IF:  You do not know what is causing your belly pain.  You have belly pain while you are sick to your stomach (nauseous) or have runny poop (diarrhea).  You have pain while you pee or poop.  Your belly pain wakes you up at night.  You have belly pain that gets worse or better when you eat.  You have belly pain that gets worse when you eat fatty foods.  You have a fever. GET HELP RIGHT AWAY IF:   The pain does not go away within 2 hours.  You keep throwing up (vomiting).  The pain changes and is only in the right or left part of the belly.  You have bloody or tarry looking poop. MAKE SURE YOU:   Understand these instructions.  Will watch your condition.  Will get help right away if you are not doing well or get worse. Document Released: 02/03/2008 Document Revised: 08/22/2013 Document Reviewed: 04/26/2013 [employer] Patient Information 2015 [employer] [place]. This information is not intended to replace advice given to you by your health care provider. Make sure you discuss any questions you have with your health care provider.

## 2018-05-20 ENCOUNTER — Ambulatory Visit: Payer: Self-pay | Admitting: Allergy & Immunology

## 2018-06-15 ENCOUNTER — Ambulatory Visit (INDEPENDENT_AMBULATORY_CARE_PROVIDER_SITE_OTHER): Payer: 59 | Admitting: Allergy and Immunology

## 2018-06-15 ENCOUNTER — Encounter: Payer: Self-pay | Admitting: Allergy and Immunology

## 2018-06-15 ENCOUNTER — Other Ambulatory Visit: Payer: Self-pay | Admitting: Allergy and Immunology

## 2018-06-15 VITALS — BP 126/74 | HR 88 | Temp 98.3°F | Resp 20 | Ht 66.0 in | Wt 217.6 lb

## 2018-06-15 DIAGNOSIS — H1013 Acute atopic conjunctivitis, bilateral: Secondary | ICD-10-CM

## 2018-06-15 DIAGNOSIS — T783XXA Angioneurotic edema, initial encounter: Secondary | ICD-10-CM | POA: Insufficient documentation

## 2018-06-15 DIAGNOSIS — J4530 Mild persistent asthma, uncomplicated: Secondary | ICD-10-CM | POA: Insufficient documentation

## 2018-06-15 DIAGNOSIS — L500 Allergic urticaria: Secondary | ICD-10-CM | POA: Diagnosis not present

## 2018-06-15 DIAGNOSIS — T7840XD Allergy, unspecified, subsequent encounter: Secondary | ICD-10-CM

## 2018-06-15 DIAGNOSIS — K2950 Unspecified chronic gastritis without bleeding: Secondary | ICD-10-CM

## 2018-06-15 DIAGNOSIS — H1010 Acute atopic conjunctivitis, unspecified eye: Secondary | ICD-10-CM | POA: Insufficient documentation

## 2018-06-15 DIAGNOSIS — J3089 Other allergic rhinitis: Secondary | ICD-10-CM

## 2018-06-15 MED ORDER — FLUTICASONE PROPIONATE 93 MCG/ACT NA EXHU: 2.0000 "application " | INHALANT_SUSPENSION | Freq: Two times a day (BID) | NASAL | 5 refills | Status: DC | PRN

## 2018-06-15 MED ORDER — LEVOCETIRIZINE DIHYDROCHLORIDE 5 MG PO TABS: 5.0000 mg | ORAL_TABLET | Freq: Every evening | ORAL | 5 refills | Status: AC

## 2018-06-15 MED ORDER — MONTELUKAST SODIUM 10 MG PO TABS: 10.0000 mg | ORAL_TABLET | Freq: Every day | ORAL | 5 refills | Status: AC

## 2018-06-15 MED ORDER — FAMOTIDINE 20 MG PO TABS: 20.0000 mg | ORAL_TABLET | Freq: Two times a day (BID) | ORAL | 5 refills | Status: DC | PRN

## 2018-06-15 MED ORDER — FLUTICASONE PROPIONATE 93 MCG/ACT NA EXHU: 2.0000 "application " | INHALANT_SUSPENSION | Freq: Two times a day (BID) | NASAL | 5 refills | Status: AC | PRN

## 2018-06-15 NOTE — Assessment & Plan Note (Deleted)
   Montelukast has been prescribed (as above).  Continue albuterol HFA, 1 to 2 inhalations every 4-6 hours if needed.  Subjective and objective measures of pulmonary function will be followed and the treatment plan will be adjusted accordingly. 

## 2018-06-15 NOTE — Assessment & Plan Note (Signed)
   Treatment plan as outlined above for allergic rhinitis.  A prescription has been provided for Pataday, one drop per eye daily as needed.  I have also recommended eye lubricant drops (i.e., Natural Tears) as needed. 

## 2018-06-15 NOTE — Assessment & Plan Note (Addendum)
Unclear etiology, however the history and skin tests suggest the possibility of allergic urticaria secondary to pollen exposure. Skin tests to select food allergens were negative today. NSAIDs and emotional stress commonly exacerbate urticaria but are not the underlying etiology in this case. Physical urticarias are negative by history (i.e. pressure-induced, temperature, vibration, solar, etc.). History and lesions are not consistent with urticaria pigmentosa so I am not suspicious for mastocytosis. There are no concomitant symptoms concerning for anaphylaxis or constitutional symptoms worrisome for an underlying malignancy. We will rule out other potential etiologies with labs. For symptom relief, patient is to take oral antihistamines as directed.  To be thorough, the following labs have been ordered: FCeRI antibody, anti-thyroglobulin antibody, thyroid peroxidase antibody, tryptase, urea breath test, CBC, CMP, ESR, ANA, and galactose-alpha-1,3-galactose IgE level.  The patient will be called with further recommendations after lab results have returned.  Instructions have been discussed and provided for H1/H2 receptor blockade with titration to find lowest effective dose.  A prescription has been provided for levocetirizine, 5 mg daily as needed.  A prescription has been provided for famotidine (Pepcid), 20 mg twice daily as needed.   A prescription has been provided for montelukast 10 mg daily at bedtime.  Should there be a significant increase or change in symptoms, a journal is to be kept recording any foods eaten, beverages consumed, medications taken within a 6 hour period prior to the onset of symptoms, as well as record activities being performed, and environmental conditions. For any symptoms concerning for anaphylaxis, 911 is to be called immediately.

## 2018-06-15 NOTE — Assessment & Plan Note (Signed)
Associated angioedema occurs in up to 50% of patients with chronic urticaria.  Treatment/diagnostic plan as outlined above. 

## 2018-06-15 NOTE — Patient Instructions (Addendum)
Recurrent urticaria Unclear etiology, however the history and skin tests suggest the possibility of allergic urticaria secondary to pollen exposure. Skin tests to select food allergens were negative today. NSAIDs and emotional stress commonly exacerbate urticaria but are not the underlying etiology in this case. Physical urticarias are negative by history (i.e. pressure-induced, temperature, vibration, solar, etc.). History and lesions are not consistent with urticaria pigmentosa so I am not suspicious for mastocytosis. There are no concomitant symptoms concerning for anaphylaxis or constitutional symptoms worrisome for an underlying malignancy. We will rule out other potential etiologies with labs. For symptom relief, patient is to take oral antihistamines as directed.  To be thorough, the following labs have been ordered: FCeRI antibody, anti-thyroglobulin antibody, thyroid peroxidase antibody, tryptase, urea breath test, CBC, CMP, ESR, ANA, and galactose-alpha-1,3-galactose IgE level.  The patient will be called with further recommendations after lab results have returned.  Instructions have been discussed and provided for H1/H2 receptor blockade with titration to find lowest effective dose.  A prescription has been provided for levocetirizine, 5 mg daily as needed.  A prescription has been provided for famotidine (Pepcid), 20 mg twice daily as needed.   A prescription has been provided for montelukast 10 mg daily at bedtime.  Should there be a significant increase or change in symptoms, a journal is to be kept recording any foods eaten, beverages consumed, medications taken within a 6 hour period prior to the onset of symptoms, as well as record activities being performed, and environmental conditions. For any symptoms concerning for anaphylaxis, 911 is to be called immediately.  Angioedema Associated angioedema occurs in up to 50% of patients with chronic urticaria.  Treatment/diagnostic  plan as outlined above.  Seasonal and perennial allergic rhinitis  Aeroallergen avoidance measures have been discussed and provided in written form.  Levocetirizine and montelukast have been prescribed (as above).  A prescription has been provided for [facility], 2 actuations per nostril twice a day. Proper technique has been discussed and demonstrated.    Nasal saline spray (i.e., Simply Saline) or nasal saline lavage (i.e., NeilMed) is recommended as needed and prior to medicated nasal sprays.  If allergen avoidance measures and medications fail to adequately relieve symptoms, aeroallergen immunotherapy will be considered.  Allergic conjunctivitis  Treatment plan as outlined above for allergic rhinitis.  A prescription has been provided for Pataday, one drop per eye daily as needed.  I have also recommended eye lubricant drops (i.e., Natural Tears) as needed.   When lab results have returned the patient will be called with further recommendations and follow up instructions.  Urticaria (Hives)  . Levocetirizine (Xyzal) 5 mg twice a day and famotidine (Pepcid) 20 mg twice a day. If no symptoms for 7-14 days then decrease to. . Levocetirizine (Xyzal) 5 mg twice a day and famotidine (Pepcid) 20 mg once a day.  If no symptoms for 7-14 days then decrease to. . Levocetirizine (Xyzal) 5 mg twice a day.  If no symptoms for 7-14 days then decrease to. . Levocetirizine (Xyzal) 5 mg once a day.  May use Benadryl (diphenhydramine) as needed for breakthrough symptoms       If symptoms return, then step up dosage  Control of Dust Mite Allergen  House dust mites play a major role in allergic asthma and rhinitis.  They occur in environments with high humidity wherever human skin, the food for dust mites is found. High levels have been detected in dust obtained from mattresses, pillows, carpets, upholstered furniture, bed covers, clothes and  soft toys.  The principal allergen of the house dust mite  is found in its feces.  A gram of dust may contain 1,000 mites and 250,000 fecal particles.  Mite antigen is easily measured in the air during house cleaning activities.    1. Encase mattresses, including the box spring, and pillow, in an air tight cover.  Seal the zipper end of the encased mattresses with wide adhesive tape. 2. Wash the bedding in water of 130 degrees Farenheit weekly.  Avoid cotton comforters/quilts and flannel bedding: the most ideal bed covering is the dacron comforter. 3. Remove all upholstered furniture from the bedroom. 4. Remove carpets, carpet padding, rugs, and non-washable window drapes from the bedroom.  Wash drapes weekly or use plastic window coverings. 5. Remove all non-washable stuffed toys from the bedroom.  Wash stuffed toys weekly. 6. Have the room cleaned frequently with a vacuum cleaner and a damp dust-mop.  The patient should not be in a room which is being cleaned and should wait 1 hour after cleaning before going into the room. 7. Close and seal all heating outlets in the bedroom.  Otherwise, the room will become filled with dust-laden air.  An electric heater can be used to heat the room. Reduce indoor humidity to less than 50%.  Do not use a humidifier.   Reducing Pollen Exposure  The American Academy of Allergy, Asthma and Immunology suggests the following steps to reduce your exposure to pollen during allergy seasons.    1. Do not hang sheets or clothing out to dry; pollen may collect on these items. 2. Do not mow lawns or spend time around freshly cut grass; mowing stirs up pollen. 3. Keep windows closed at night.  Keep car windows closed while driving. 4. Minimize morning activities outdoors, a time when pollen counts are usually at their highest. 5. Stay indoors as much as possible when pollen counts or humidity is high and on windy days when pollen tends to remain in the air longer. 6. Use air conditioning when possible.  Many air conditioners  have filters that trap the pollen spores. 7. Use a HEPA room air filter to remove pollen form the indoor air you breathe.   Control of Dog or Cat Allergen  Avoidance is the best way to manage a dog or cat allergy. If you have a dog or cat and are allergic to dog or cats, consider removing the dog or cat from the home. If you have a dog or cat but don't want to find it a new home, or if your family wants a pet even though someone in the household is allergic, here are some strategies that may help keep symptoms at bay:  1. Keep the pet out of your bedroom and restrict it to only a few rooms. Be advised that keeping the dog or cat in only one room will not limit the allergens to that room. 2. Don't pet, hug or kiss the dog or cat; if you do, wash your hands with soap and water. 3. High-efficiency particulate air (HEPA) cleaners run continuously in a bedroom or living room can reduce allergen levels over time. 4. Place electrostatic material sheet in the air inlet vent in the bedroom. 5. Regular use of a high-efficiency vacuum cleaner or a central vacuum can reduce allergen levels. 6. Giving your dog or cat a bath at least once a week can reduce airborne allergen.   Control of Mold Allergen  Mold and fungi can grow on  a variety of surfaces provided certain temperature and moisture conditions exist.  Outdoor molds grow on plants, decaying vegetation and soil.  The major outdoor mold, Alternaria and Cladosporium, are found in very high numbers during hot and dry conditions.  Generally, a late Summer - Fall peak is seen for common outdoor fungal spores.  Rain will temporarily lower outdoor mold spore count, but counts rise rapidly when the rainy period ends.  The most important indoor molds are Aspergillus and Penicillium.  Dark, humid and poorly ventilated basements are ideal sites for mold growth.  The next most common sites of mold growth are the bathroom and the kitchen.  Outdoor [employer] 1. Use air conditioning and keep windows closed 2. Avoid exposure to decaying vegetation. 3. Avoid leaf raking. 4. Avoid grain handling. 5. Consider wearing a face mask if working in moldy areas.  Indoor Mold Control 1. Maintain humidity below 50%. 2. Clean washable surfaces with 5% bleach solution. 3. Remove sources e.g. Contaminated carpets.

## 2018-06-15 NOTE — Progress Notes (Signed)
New Patient Note  RE: Stacy Cole MRN: [id number] DOB: [birth year] Date of Office Visit: 06/15/2018  Referring provider: No ref. provider found Primary care provider: Garlan Fillers, MD  Chief Complaint: Urticaria and Allergic Rhinitis    History of present illness: Stacy Cole is a 42 y.o. female presenting today for evaluation of hives. Over the past 5 years, Stacy Cole has experienced recurrent episodes of hives. The hives have appeared at different times over her entire body.  The lesions are described as erythematous, raised, and pruritic.  Individual hives last less than 24 hours without leaving residual pigmentation or bruising. She has experienced associated angioedema of the eyelid but has not experienced concomitant cardiopulmonary symptoms or GI symptoms. She has not experienced unexpected weight loss, recurrent fevers or drenching night sweats. No specific medication, food, skin care product, detergent, soap, or other environmental triggers have been identified.  She does note that the hives seem to appear during the pollen seasons more than other times of the year.  The symptoms do not seem to correlate with NSAIDs use or emotional stress. She did not have symptoms consistent with a respiratory tract infection at the time of symptom onset. [name] has tried to control symptoms with OTC antihistamines which have offered moderate temporary relief. She has not been evaluated and treated in the emergency department for these symptoms. Skin biopsy has not been performed. Recently, she has been experiencing hives a few times a month. Stacy Cole experiences nasal congestion, rhinorrhea, sneezing, postnasal drainage, nasal pruritus, and ocular pruritus.  These symptoms occur year-round but they are more frequent and [name] during the springtime and in the fall.  She attempts to control the symptoms with cetirizine.  Assessment and plan: Recurrent urticaria Unclear etiology, however the history and  skin tests suggest the possibility of allergic urticaria secondary to pollen exposure. Skin tests to select food allergens were negative today. NSAIDs and emotional stress commonly exacerbate urticaria but are not the underlying etiology in this case. Physical urticarias are negative by history (i.e. pressure-induced, temperature, vibration, solar, etc.). History and lesions are not consistent with urticaria pigmentosa so I am not suspicious for mastocytosis. There are no concomitant symptoms concerning for anaphylaxis or constitutional symptoms worrisome for an underlying malignancy. We will rule out other potential etiologies with labs. For symptom relief, patient is to take oral antihistamines as directed.  To be thorough, the following labs have been ordered: FCeRI antibody, anti-thyroglobulin antibody, thyroid peroxidase antibody, tryptase, urea breath test, CBC, CMP, ESR, ANA, and galactose-alpha-1,3-galactose IgE level.  The patient will be called with further recommendations after lab results have returned.  Instructions have been discussed and provided for H1/H2 receptor blockade with titration to find lowest effective dose.  A prescription has been provided for levocetirizine, 5 mg daily as needed.  A prescription has been provided for famotidine (Pepcid), 20 mg twice daily as needed.   A prescription has been provided for montelukast 10 mg daily at bedtime.  Should there be a significant increase or change in symptoms, a journal is to be kept recording any foods eaten, beverages consumed, medications taken within a 6 hour period prior to the onset of symptoms, as well as record activities being performed, and environmental conditions. For any symptoms concerning for anaphylaxis, 911 is to be called immediately.  Angioedema Associated angioedema occurs in up to 50% of patients with chronic urticaria.  Treatment/diagnostic plan as outlined above.  Seasonal and perennial allergic  rhinitis  Aeroallergen avoidance measures have been  discussed and provided in written form.  Levocetirizine and montelukast have been prescribed (as above).  A prescription has been provided for [facility], 2 actuations per nostril twice a day. Proper technique has been discussed and demonstrated.    Nasal saline spray (i.e., Simply Saline) or nasal saline lavage (i.e., NeilMed) is recommended as needed and prior to medicated nasal sprays.  If allergen avoidance measures and medications fail to adequately relieve symptoms, aeroallergen immunotherapy will be considered.  Allergic conjunctivitis  Treatment plan as outlined above for allergic rhinitis.  A prescription has been provided for Pataday, one drop per eye daily as needed.  I have also recommended eye lubricant drops (i.e., Natural Tears) as needed.   Meds ordered this encounter  Medications  . levocetirizine (XYZAL) 5 MG tablet    Sig: Take 1 tablet (5 mg total) by mouth every evening.    Dispense:  30 tablet    Refill:  5  . famotidine (PEPCID) 20 MG tablet    Sig: Take 1 tablet (20 mg total) by mouth 2 (two) times daily as needed for heartburn or indigestion.    Dispense:  60 tablet    Refill:  5  . montelukast (SINGULAIR) 10 MG tablet    Sig: Take 1 tablet (10 mg total) by mouth at bedtime.    Dispense:  30 tablet    Refill:  5  . Fluticasone Propionate (XHANCE) 93 MCG/ACT EXHU    Sig: Place 2 application into the nose 2 (two) times daily as needed.    Dispense:  16 mL    Refill:  5    Diagnostics: Environmental skin testing: Positive to grass pollen, weed pollen, ragweed pollen, tree pollen, molds, cat hair, dog epithelia, cockroach antigen, and dust mite antigen. Food allergen skin testing: Negative despite a positive histamine control.    Physical examination: Blood pressure 126/74, pulse 88, temperature 98.3 F (36.8 C), temperature source Oral, resp. rate 20, height 5' 6" (1.676 m), weight 217 lb 9.5 oz  (98.7 kg), SpO2 96 %, unknown if currently breastfeeding.  General: Alert, interactive, in no acute distress. HEENT: TMs pearly gray, turbinates moderately edematous with clear discharge, post-pharynx mildly erythematous. Neck: Supple without lymphadenopathy. Lungs: Clear to auscultation without wheezing, rhonchi or rales. CV: Normal S1, S2 without murmurs. Abdomen: Nondistended, nontender. Skin: Warm and dry, without lesions or rashes. Extremities:  No clubbing, cyanosis or edema. Neuro:   Grossly intact.  Review of systems:  Review of systems negative except as noted in HPI / PMHx or noted below: Review of Systems  Constitutional: Negative.   HENT: Negative.   Eyes: Negative.   Respiratory: Negative.   Cardiovascular: Negative.   Gastrointestinal: Negative.   Genitourinary: Negative.   Musculoskeletal: Negative.   Skin: Negative.   Neurological: Negative.   Endo/Heme/Allergies: Negative.   Psychiatric/Behavioral: Negative.     Past medical history:  Past Medical History:  Diagnosis Date  . AMA (advanced maternal age) multigravida 46+   . Chicken pox   . Depression   . Gestational diabetes    glyburide    Past surgical history:  Past Surgical History:  Procedure Laterality Date  . NO PAST SURGERIES      Family history: Family History  Problem Relation Age of Onset  . Hypertension Maternal Aunt     Social history: Social History   Socioeconomic History  . Marital status: Married    Spouse name: Not on file  . Number of children: Not on file  . Years  of education: Not on file  . Highest education level: Not on file  Occupational History  . Not on file  Social Needs  . Financial resource strain: Not on file  . Food insecurity:    Worry: Not on file    Inability: Not on file  . Transportation needs:    Medical: Not on file    Non-medical: Not on file  Tobacco Use  . Smoking status: Never Smoker  . Smokeless tobacco: Never Used  Substance and  Sexual Activity  . Alcohol use: No  . Drug use: No  . Sexual activity: Yes    Birth control/protection: None  Lifestyle  . Physical activity:    Days per week: Not on file    Minutes per session: Not on file  . Stress: Not on file  Relationships  . Social connections:    Talks on phone: Not on file    Gets together: Not on file    Attends religious service: Not on file    Active member of club or organization: Not on file    Attends meetings of clubs or organizations: Not on file    Relationship status: Not on file  . Intimate partner violence:    Fear of current or ex partner: Not on file    Emotionally abused: Not on file    Physically abused: Not on file    Forced sexual activity: Not on file  Other Topics Concern  . Not on file  Social History Narrative  . Not on file   Environmental History: Patient lives in a house with carpeting in the bedroom, gas heat, and central air.  She is a non-smoker without pets.  There is no known mold/water damage in the home.  Allergies as of 06/15/2018   No Known Allergies     Medication List        Accurate as of 06/15/18 12:43 PM. Always use your most recent med list.          cetirizine 10 MG tablet Commonly known as:  ZYRTEC Take by mouth.   famotidine 20 MG tablet Commonly known as:  PEPCID Take 1 tablet (20 mg total) by mouth 2 (two) times daily as needed for heartburn or indigestion.   Fluticasone Propionate 93 MCG/ACT Exhu Place 2 application into the nose 2 (two) times daily as needed.   ibuprofen 600 MG tablet Commonly known as:  ADVIL,MOTRIN Take 1 tablet (600 mg total) by mouth every 6 (six) hours as needed for mild pain or cramping.   levocetirizine 5 MG tablet Commonly known as:  XYZAL Take 1 tablet (5 mg total) by mouth every evening.   montelukast 10 MG tablet Commonly known as:  SINGULAIR Take 1 tablet (10 mg total) by mouth at bedtime.   triamcinolone ointment 0.1 % Commonly known as:   KENALOG use as directed       Known medication allergies: No Known Allergies  I appreciate the opportunity to take part in Xin's care. Please do not hesitate to contact me with questions.  Sincerely,   R. Edgar Frisk, MD

## 2018-06-15 NOTE — Assessment & Plan Note (Addendum)
   Aeroallergen avoidance measures have been discussed and provided in written form.  Levocetirizine and montelukast have been prescribed (as above).  A prescription has been provided for Xhance, 2 actuations per nostril twice a day. Proper technique has been discussed and demonstrated.  Nasal saline spray (i.e., Simply Saline) or nasal saline lavage (i.e., NeilMed) is recommended as needed and prior to medicated nasal sprays.  If allergen avoidance measures and medications fail to adequately relieve symptoms, aeroallergen immunotherapy will be considered. 

## 2018-06-22 LAB — H. PYLORI BREATH TEST: H pylori Breath Test: NEGATIVE

## 2018-06-24 LAB — COMPREHENSIVE METABOLIC PANEL
ALT: 16 IU/L (ref 0–32)
AST: 26 IU/L (ref 0–40)
Albumin/Globulin Ratio: 1.5 (ref 1.2–2.2)
Albumin: 4.2 g/dL (ref 3.5–5.5)
Alkaline Phosphatase: 87 IU/L (ref 39–117)
BUN/Creatinine Ratio: 8 — ABNORMAL LOW (ref 9–23)
BUN: 6 mg/dL (ref 6–24)
Bilirubin Total: 0.3 mg/dL (ref 0.0–1.2)
CO2: 25 mmol/L (ref 20–29)
Calcium: 9.6 mg/dL (ref 8.7–10.2)
Chloride: 100 mmol/L (ref 96–106)
Creatinine, Ser: 0.72 mg/dL (ref 0.57–1.00)
GFR calc Af Amer: 118 mL/min/{1.73_m2} (ref >59)
GFR calc non Af Amer: 102 mL/min/{1.73_m2} (ref >59)
Globulin, Total: 2.8 g/dL (ref 1.5–4.5)
Glucose: 150 mg/dL — ABNORMAL HIGH (ref 65–99)
Potassium: 4.3 mmol/L (ref 3.5–5.2)
Sodium: 139 mmol/L (ref 134–144)
Total Protein: 7 g/dL (ref 6.0–8.5)

## 2018-06-24 LAB — ALPHA-GAL PANEL
Alpha Gal IgE*: <0.1 kU/L (ref <0.10)
Beef (Bos spp) IgE: <0.1 kU/L (ref <0.35)
Class Interpretation: 0
Class Interpretation: 0
Class Interpretation: 0
Lamb/Mutton (Ovis spp) IgE: <0.1 kU/L (ref <0.35)
Pork (Sus spp) IgE: <0.1 kU/L (ref <0.35)

## 2018-06-24 LAB — CBC WITH DIFFERENTIAL/PLATELET
Basophils Absolute: 0.1 10*3/uL (ref 0.0–0.2)
Basos: 1 %
EOS (ABSOLUTE): 0.3 10*3/uL (ref 0.0–0.4)
Eos: 5 %
Hematocrit: 38.6 % (ref 34.0–46.6)
Hemoglobin: 12.8 g/dL (ref 11.1–15.9)
Immature Grans (Abs): 0 10*3/uL (ref 0.0–0.1)
Immature Granulocytes: 0 %
Lymphocytes Absolute: 2.1 10*3/uL (ref 0.7–3.1)
Lymphs: 40 %
MCH: 29 pg (ref 26.6–33.0)
MCHC: 33.2 g/dL (ref 31.5–35.7)
MCV: 88 fL (ref 79–97)
Monocytes Absolute: 0.5 10*3/uL (ref 0.1–0.9)
Monocytes: 9 %
Neutrophils Absolute: 2.4 10*3/uL (ref 1.4–7.0)
Neutrophils: 45 %
Platelets: 290 10*3/uL (ref 150–450)
RBC: 4.41 x10E6/uL (ref 3.77–5.28)
RDW: 12.3 % (ref 12.3–15.4)
WBC: 5.3 10*3/uL (ref 3.4–10.8)

## 2018-06-24 LAB — SEDIMENTATION RATE: Sed Rate: 10 mm/hr (ref 0–32)

## 2018-06-24 LAB — THYROID PEROXIDASE ANTIBODY: Thyroperoxidase Ab SerPl-aCnc: 16 IU/mL (ref 0–34)

## 2018-06-24 LAB — ANA W/REFLEX IF POSITIVE: Anti Nuclear Antibody(ANA): NEGATIVE

## 2018-06-24 LAB — THYROGLOBULIN ANTIBODY: Thyroglobulin Antibody: <1 IU/mL (ref 0.0–0.9)

## 2018-06-24 LAB — CHRONIC URTICARIA: cu index: 10.2 — ABNORMAL HIGH (ref <10)

## 2018-06-24 LAB — TRYPTASE: Tryptase: 5.5 ug/L (ref 2.2–13.2)

## 2019-02-08 ENCOUNTER — Other Ambulatory Visit: Payer: Self-pay | Admitting: *Deleted

## 2019-02-08 MED ORDER — FAMOTIDINE 20 MG PO TABS: 20.0000 mg | ORAL_TABLET | Freq: Two times a day (BID) | ORAL | 0 refills | Status: AC | PRN

## 2020-06-04 ENCOUNTER — Other Ambulatory Visit (HOSPITAL_BASED_OUTPATIENT_CLINIC_OR_DEPARTMENT_OTHER): Payer: Self-pay | Admitting: Emergency Medicine

## 2020-06-04 ENCOUNTER — Emergency Department (HOSPITAL_BASED_OUTPATIENT_CLINIC_OR_DEPARTMENT_OTHER)
Admission: EM | Admit: 2020-06-04 | Discharge: 2020-06-04 | Disposition: A | Discharge status: Home / Self Care | Payer: HRSA Program | Attending: Emergency Medicine | Admitting: Emergency Medicine

## 2020-06-04 ENCOUNTER — Other Ambulatory Visit: Payer: Self-pay

## 2020-06-04 ENCOUNTER — Telehealth (HOSPITAL_COMMUNITY): Payer: Self-pay | Admitting: Nurse Practitioner

## 2020-06-04 ENCOUNTER — Encounter (HOSPITAL_BASED_OUTPATIENT_CLINIC_OR_DEPARTMENT_OTHER): Payer: Self-pay | Admitting: *Deleted

## 2020-06-04 DIAGNOSIS — U071 COVID-19: Secondary | ICD-10-CM

## 2020-06-04 DIAGNOSIS — R059 Cough, unspecified: Secondary | ICD-10-CM | POA: Diagnosis present

## 2020-06-04 LAB — RESPIRATORY PANEL BY RT PCR (FLU A&B, COVID)
Influenza A by PCR: NEGATIVE
Influenza B by PCR: NEGATIVE
SARS Coronavirus 2 by RT PCR: POSITIVE — AB

## 2020-06-04 MED ORDER — ACETAMINOPHEN 325 MG PO TABS
650.0000 mg | ORAL_TABLET | Freq: Once | ORAL | Status: AC
  Administered 2020-06-04: 650 mg via ORAL
  Filled 2020-06-04: qty 2

## 2020-06-04 MED ORDER — BENZONATATE 100 MG PO CAPS: 100.0000 mg | ORAL_CAPSULE | Freq: Three times a day (TID) | ORAL | 0 refills | Status: DC | PRN

## 2020-06-04 MED FILL — BENZONATATE 100 MG CAPS: 100 | 7 days supply | Qty: 21 | Fill #0

## 2020-06-04 NOTE — ED Provider Notes (Signed)
[facility] EMERGENCY DEPARTMENT Provider Note   CSN: [id number] Arrival date & time: 06/04/20  1311     History Chief Complaint  Patient presents with  . Cough  . Fever    Covid +    Stacy Cole is a 44 y.o. female presenting to the emergency department with complaint of days of fever and URI symptoms.  Patient states on Saturday her symptoms began with fever, body aches and chills.  She endorses associated cough, fatigue, loss of taste and smell, headache. She has been treating her symptoms with Tylenol with moderate relief. She denies associated abdominal complaints or shortness of breath. No known Covid exposures. She is not vaccinated against COVID-19.  The history is provided by the patient.       Past Medical History:  Diagnosis Date  . AMA (advanced maternal age) multigravida 35+   . Chicken pox   . Depression   . Gestational diabetes    glyburide    Patient Active Problem List   Diagnosis Date Noted  . Recurrent urticaria 06/15/2018  . Angioedema 06/15/2018  . Seasonal and perennial allergic rhinitis 06/15/2018  . Allergic conjunctivitis 06/15/2018  . Supervision of other normal pregnancy 09/10/2014  . Normal labor 09/09/2014  . Pregnancy 09/08/2014    Past Surgical History:  Procedure Laterality Date  . NO PAST SURGERIES       OB History    Gravida  4   Para  4   Term  4   Preterm  0   AB  0   Living  4     SAB  0   TAB  0   Ectopic  0   Multiple  0   Live Births  4           Family History  Problem Relation Age of Onset  . Hypertension Maternal Aunt     Social History   Tobacco Use  . Smoking status: Never Smoker  . Smokeless tobacco: Never Used  Vaping Use  . Vaping Use: Never used  Substance Use Topics  . Alcohol use: No  . Drug use: No    Home Medications Prior to Admission medications   Medication Sig Start Date End Date Taking? Authorizing Provider  benzonatate (TESSALON) 100 MG capsule Take 1  capsule (100 mg total) by mouth 3 (three) times daily as needed for cough. 06/04/20   Brenen Beigel, [place] N, PA-C  cetirizine (ZYRTEC) 10 MG tablet Take by mouth. 01/20/18   [provider]  famotidine (PEPCID) 20 MG tablet Take 1 tablet (20 mg total) by mouth 2 (two) times daily as needed for heartburn or indigestion. 02/08/19   Bobbitt, Heywood Iles, MD  Fluticasone Propionate Timmothy Sours) 93 MCG/ACT EXHU Place 2 application into the nose 2 (two) times daily as needed. 06/15/18   Bobbitt, Heywood Iles, MD  ibuprofen (ADVIL,MOTRIN) 600 MG tablet Take 1 tablet (600 mg total) by mouth every 6 (six) hours as needed for mild pain or cramping. 09/12/14   Marlow Baars, MD  levocetirizine (XYZAL) 5 MG tablet Take 1 tablet (5 mg total) by mouth every evening. 06/15/18   Bobbitt, Heywood Iles, MD  montelukast (SINGULAIR) 10 MG tablet Take 1 tablet (10 mg total) by mouth at bedtime. 06/15/18   Bobbitt, Heywood Iles, MD  triamcinolone ointment (KENALOG) 0.1 % use as directed 05/31/18   [provider]    Allergies    Patient has no known allergies.  Review of Systems   Review  of Systems  All other systems reviewed and are negative.   Physical Exam Updated Vital Signs BP 101/79   Pulse (!) 110   Temp 100.3 F (37.9 C) (Oral)   Resp 20   Ht 5\' 6"  (1.676 m)   Wt 93.6 kg   LMP 05/21/2020   SpO2 98%   BMI 33.30 kg/m   Physical Exam Vitals and nursing note reviewed.  Constitutional:      General: She is not in acute distress.    Appearance: She is well-developed.  HENT:     Head: Normocephalic and atraumatic.  Eyes:     Conjunctiva/sclera: Conjunctivae normal.  Cardiovascular:     Rate and Rhythm: Normal rate and regular rhythm.  Pulmonary:     Effort: Pulmonary effort is normal. No respiratory distress.     Breath sounds: Normal breath sounds.  Abdominal:     General: Bowel sounds are normal.     Tenderness: There is no abdominal tenderness.  Neurological:     Mental Status:  She is alert.  Psychiatric:        Mood and Affect: Mood normal.        Behavior: Behavior normal.     ED Results / Procedures / Treatments   Labs (all labs ordered are listed, but only abnormal results are displayed) Labs Reviewed  RESPIRATORY PANEL BY RT PCR (FLU A&B, COVID) - Abnormal; Notable for the following components:      Result Value   SARS Coronavirus 2 by RT PCR POSITIVE (*)    All other components within normal limits    EKG None  Radiology No results found.  Procedures Procedures (including critical care time)  Medications Ordered in ED Medications  acetaminophen (TYLENOL) tablet 650 mg (650 mg Oral Given 06/04/20 1328)    ED Course  I have reviewed the triage vital signs and the nursing notes.  Pertinent labs & imaging results that were available during my care of the patient were reviewed by me and considered in my medical decision making (see chart for details).    Stacy Cole was evaluated in Emergency Department on 06/04/2020 for the symptoms described in the history of present illness. She was evaluated in the context of the global COVID-19 pandemic, which necessitated consideration that the patient might be at risk for infection with the SARS-CoV-2 virus that causes COVID-19. Institutional protocols and algorithms that pertain to the evaluation of patients at risk for COVID-19 are in a state of rapid change based on information released by regulatory bodies including the CDC and federal and state organizations. These policies and algorithms were followed during the patient's care in the ED.  MDM Rules/Calculators/A&P                          Patient presenting with URI symptoms with fever, body aches and chills. COVID test is positive. No known exposure. Patient is not vaccinated against COVID-19. Patient has low grade fever, treated with tylenol. Lungs clear to auscultation bilaterally. Normal O2 saturation. Pt will be discharged with symptomatic  treatment, home isolation precautions. Will contact [facility] infusion clinic, as she qualifies based on BMI. Return precautions discussed. Verbalizes understanding and is agreeable with plan. Pt is hemodynamically stable & in NAD prior to dc.  Final Clinical Impression(s) / ED Diagnoses Final diagnoses:  COVID-19    Rx / DC Orders ED Discharge Orders         Ordered    benzonatate (  TESSALON) 100 MG capsule  3 times daily PRN        06/04/20 1503           Derrell Milanes, [place] N, [place] 06/04/20 1506    Milagros Loll, MD 06/05/20 [phone]

## 2020-06-04 NOTE — Discharge Instructions (Addendum)
Please read instructions below.  You can alternate Tylenol/acetaminophen and Advil/ibuprofen/Motrin every 4 hours for sore throat, body aches, headache or fever.  Drink plenty of water.  Use saline nasal spray for congestion. You can take Tessalon every 8 hours as needed for cough. Wash your hands frequently. Your COVID test  is POSITIVE. > Isolate at home for at least 14 days after the day your symptoms initially began, and THEN at least 24 hours after you are fever-free without the help of medications AND your symptoms are improving.  he [facility] infusion clinic will contact you to discuss scheduling an infusion. Follow up with your primary care provider or the post-COVID care clinic at [facility] Return to the ER for significant shortness of breath, uncontrollable vomiting, Hewitt chest pain, or other concerning symptoms.

## 2020-06-04 NOTE — ED Triage Notes (Signed)
Cough, chills, fever, body aches, headache, fatigue, loss of taste, and loss of smell x 3 days.

## 2020-06-04 NOTE — Telephone Encounter (Signed)
Called to Discuss with patient about Covid symptoms and the use of the monoclonal antibody infusion for those with mild to moderate Covid symptoms and at a high risk of hospitalization.    Reviewed indications for infusion, and possible side effects.   Pt appears to qualify for this infusion due to co-morbid conditions and/or a member of an at-risk group in accordance with the FDA Emergency Use Authorization.    Symptom onset: Saturday 10/2.   Her husband is currently getting tested for COVID at [facility] ED.   She is interested in infusion but would like to wait until her husband returns home to schedule- I let her know we could schedule both her and her husband at the same time.   Return call phone number given- encouraged her to call back as soon as possible noting the 10 day onset of symptoms limit.   Ocie Bob, AGNP-C

## 2023-06-09 ENCOUNTER — Other Ambulatory Visit (HOSPITAL_BASED_OUTPATIENT_CLINIC_OR_DEPARTMENT_OTHER): Payer: Self-pay

## 2024-05-22 ENCOUNTER — Emergency Department (HOSPITAL_COMMUNITY)

## 2024-05-22 ENCOUNTER — Other Ambulatory Visit: Payer: Self-pay

## 2024-05-22 ENCOUNTER — Encounter (HOSPITAL_COMMUNITY): Payer: Self-pay

## 2024-05-22 ENCOUNTER — Emergency Department (HOSPITAL_COMMUNITY)
Admission: EM | Admit: 2024-05-22 | Discharge: 2024-05-22 | Disposition: A | Discharge status: Home / Self Care | Attending: Emergency Medicine | Admitting: Emergency Medicine

## 2024-05-22 DIAGNOSIS — R1013 Epigastric pain: Secondary | ICD-10-CM | POA: Diagnosis not present

## 2024-05-22 DIAGNOSIS — R112 Nausea with vomiting, unspecified: Secondary | ICD-10-CM | POA: Diagnosis present

## 2024-05-22 DIAGNOSIS — R1032 Left lower quadrant pain: Secondary | ICD-10-CM | POA: Insufficient documentation

## 2024-05-22 LAB — CBC
HCT: 46.6 % — ABNORMAL HIGH (ref 36.0–46.0)
Hemoglobin: 14.9 g/dL (ref 12.0–15.0)
MCH: 29 pg (ref 26.0–34.0)
MCHC: 32 g/dL (ref 30.0–36.0)
MCV: 90.8 fL (ref 80.0–100.0)
Platelets: 267 K/uL (ref 150–400)
RBC: 5.13 MIL/uL — ABNORMAL HIGH (ref 3.87–5.11)
RDW: 12.5 % (ref 11.5–15.5)
WBC: 9.4 K/uL (ref 4.0–10.5)
nRBC: 0 % (ref 0.0–0.2)

## 2024-05-22 LAB — POC URINE PREG, ED: Preg Test, Ur: NEGATIVE

## 2024-05-22 LAB — RESP PANEL BY RT-PCR (RSV, FLU A&B, COVID)  RVPGX2
Influenza A by PCR: NEGATIVE
Influenza B by PCR: NEGATIVE
Resp Syncytial Virus by PCR: NEGATIVE
SARS Coronavirus 2 by RT PCR: NEGATIVE

## 2024-05-22 LAB — COMPREHENSIVE METABOLIC PANEL WITH GFR
ALT: 41 U/L (ref 0–44)
AST: 56 U/L — ABNORMAL HIGH (ref 15–41)
Albumin: 4.3 g/dL (ref 3.5–5.0)
Alkaline Phosphatase: 93 U/L (ref 38–126)
Anion gap: 13 (ref 5–15)
BUN: 7 mg/dL (ref 6–20)
CO2: 21 mmol/L — ABNORMAL LOW (ref 22–32)
Calcium: 9.5 mg/dL (ref 8.9–10.3)
Chloride: 107 mmol/L (ref 98–111)
Creatinine, Ser: 0.83 mg/dL (ref 0.44–1.00)
GFR, Estimated: >60 mL/min (ref >60)
Glucose, Bld: 157 mg/dL — ABNORMAL HIGH (ref 70–99)
Potassium: 4.1 mmol/L (ref 3.5–5.1)
Sodium: 141 mmol/L (ref 135–145)
Total Bilirubin: 0.8 mg/dL (ref 0.0–1.2)
Total Protein: 8.3 g/dL — ABNORMAL HIGH (ref 6.5–8.1)

## 2024-05-22 LAB — LIPASE, BLOOD: Lipase: 57 U/L — ABNORMAL HIGH (ref 11–51)

## 2024-05-22 LAB — URINALYSIS, ROUTINE W REFLEX MICROSCOPIC
Bilirubin Urine: NEGATIVE
Glucose, UA: NEGATIVE mg/dL
Ketones, ur: NEGATIVE mg/dL
Nitrite: NEGATIVE
Protein, ur: NEGATIVE mg/dL
Specific Gravity, Urine: 1.012 (ref 1.005–1.030)
pH: 5 (ref 5.0–8.0)

## 2024-05-22 MED ORDER — METOCLOPRAMIDE HCL 10 MG PO TABS: 10.0000 mg | ORAL_TABLET | Freq: Four times a day (QID) | ORAL | 0 refills | Status: AC

## 2024-05-22 MED ORDER — METOCLOPRAMIDE HCL 5 MG/ML IJ SOLN
10.0000 mg | Freq: Once | INTRAMUSCULAR | Status: AC
  Administered 2024-05-22: 10 mg via INTRAVENOUS
  Filled 2024-05-22: qty 2

## 2024-05-22 MED ORDER — IOHEXOL 350 MG/ML SOLN
75.0000 mL | Freq: Once | INTRAVENOUS | Status: AC | PRN
  Administered 2024-05-22: 75 mL via INTRAVENOUS

## 2024-05-22 MED ORDER — LACTATED RINGERS IV BOLUS
1000.0000 mL | Freq: Once | INTRAVENOUS | Status: AC
  Administered 2024-05-22: 1000 mL via INTRAVENOUS

## 2024-05-22 MED ORDER — KETOROLAC TROMETHAMINE 15 MG/ML IJ SOLN
15.0000 mg | Freq: Once | INTRAMUSCULAR | Status: AC
  Administered 2024-05-22: 15 mg via INTRAVENOUS
  Filled 2024-05-22: qty 1

## 2024-05-22 MED ORDER — ACETAMINOPHEN 325 MG PO TABS
975.0000 mg | ORAL_TABLET | Freq: Once | ORAL | Status: AC
  Administered 2024-05-22: 975 mg via ORAL
  Filled 2024-05-22: qty 3

## 2024-05-22 NOTE — ED Provider Notes (Signed)
 [place] EMERGENCY DEPARTMENT AT [facility] Provider Note   CSN: [id number] Arrival date & time: 05/22/24  1128     Patient presents with: Abdominal Pain, Emesis, and Nausea   Stacy Cole is a 48 y.o. female.   Abdominal Pain Associated symptoms: vomiting   Emesis Associated symptoms: abdominal pain   Patient is a 48 year old female presenting ED today for concerns for abdominal pain, nausea, vomiting, near syncope.  Reporting that this all began acutely this morning, with her reporting lightheadedness after her 6 episodes of vomiting upon trying to stand up.  Says that she has not had any blurry vision since then.  Noted to have had some mild blood streaking in her vomit on the sixth episode of vomiting but has had none since this a.m.  Endorses fever, body aches and chills.  Notes that her period is irregular as she is perimenopausal with unknown as to when her last period was.  Denies vertigo, unilateral weakness, tinnitus, dysphagia, odynophagia, chest pain, shortness of breath, diarrhea, hematochezia, melena, dysuria, vaginal discharge, vaginal bleeding, lower leg swelling.     Prior to Admission medications   Medication Sig Start Date End Date Taking? Authorizing Provider  metoCLOPramide  (REGLAN ) 10 MG tablet Take 1 tablet (10 mg total) by mouth every 6 (six) hours. 05/22/24  Yes Beola Terrall RAMAN, PA-C  cetirizine (ZYRTEC) 10 MG tablet Take by mouth. 01/20/18   [provider]  famotidine  (PEPCID ) 20 MG tablet Take 1 tablet (20 mg total) by mouth 2 (two) times daily as needed for heartburn or indigestion. 02/08/19   Bobbitt, Elgin Pepper, MD  Fluticasone  Propionate (XHANCE ) 93 MCG/ACT EXHU Place 2 application into the nose 2 (two) times daily as needed. 06/15/18   Bobbitt, Elgin Pepper, MD  ibuprofen  (ADVIL ,MOTRIN ) 600 MG tablet Take 1 tablet (600 mg total) by mouth every 6 (six) hours as needed for mild pain or cramping. 09/12/14   Clark, Dyanna, MD   levocetirizine (XYZAL ) 5 MG tablet Take 1 tablet (5 mg total) by mouth every evening. 06/15/18   Bobbitt, Elgin Pepper, MD  metFORMIN (GLUCOPHAGE) 500 MG tablet Take 500 mg by mouth 2 (two) times daily. 03/22/24   [provider]  montelukast  (SINGULAIR ) 10 MG tablet Take 1 tablet (10 mg total) by mouth at bedtime. 06/15/18   Bobbitt, Elgin Pepper, MD  triamcinolone ointment (KENALOG) 0.1 % use as directed 05/31/18   [provider]    Allergies: Patient has no allergy information on record.    Review of Systems  Gastrointestinal:  Positive for abdominal pain and vomiting.  Neurological:  Positive for light-headedness.  All other systems reviewed and are negative.   Updated Vital Signs BP 100/61   Pulse 80   Temp 98.6 F (37 C) (Oral)   Resp 20   Ht 5' 6 (1.676 m)   Wt 97.5 kg   SpO2 100%   BMI 34.70 kg/m   Physical Exam Vitals and nursing note reviewed.  Constitutional:      General: She is not in acute distress.    Appearance: Normal appearance. She is not ill-appearing or diaphoretic.  HENT:     Head: Normocephalic and atraumatic.  Eyes:     General: No scleral icterus.       Right eye: No discharge.        Left eye: No discharge.     Extraocular Movements: Extraocular movements intact.     Conjunctiva/sclera: Conjunctivae normal.  Cardiovascular:     Rate  and Rhythm: Normal rate and regular rhythm.     Pulses: Normal pulses.     Heart sounds: Normal heart sounds. No murmur heard.    No friction rub. No gallop.  Pulmonary:     Effort: Pulmonary effort is normal. No respiratory distress.     Breath sounds: No stridor. No wheezing, rhonchi or rales.  Chest:     Chest wall: No tenderness.  Abdominal:     General: Abdomen is flat. There is no distension.     Palpations: Abdomen is soft.     Tenderness: There is abdominal tenderness in the epigastric area and left upper quadrant. There is no right CVA tenderness, left CVA tenderness, guarding or  rebound. Negative signs include Murphy's sign, Rovsing's sign, McBurney's sign and psoas sign.  Musculoskeletal:        General: No swelling, deformity or signs of injury.     Cervical back: Normal range of motion. No rigidity.     Right lower leg: No edema.     Left lower leg: No edema.  Skin:    General: Skin is warm and dry.     Findings: No bruising, erythema or lesion.  Neurological:     General: No focal deficit present.     Mental Status: She is alert and oriented to person, place, and time. Mental status is at baseline.     Sensory: No sensory deficit.     Motor: No weakness.  Psychiatric:        Mood and Affect: Mood normal.     (all labs ordered are listed, but only abnormal results are displayed) Labs Reviewed  LIPASE, BLOOD - Abnormal; Notable for the following components:      Result Value   Lipase 57 (*)    All other components within normal limits  COMPREHENSIVE METABOLIC PANEL WITH GFR - Abnormal; Notable for the following components:   CO2 21 (*)    Glucose, Bld 157 (*)    Total Protein 8.3 (*)    AST 56 (*)    All other components within normal limits  CBC - Abnormal; Notable for the following components:   RBC 5.13 (*)    HCT 46.6 (*)    All other components within normal limits  URINALYSIS, ROUTINE W REFLEX MICROSCOPIC - Abnormal; Notable for the following components:   APPearance CLOUDY (*)    Hgb urine dipstick SMALL (*)    Leukocytes,Ua SMALL (*)    Bacteria, UA RARE (*)    All other components within normal limits  RESP PANEL BY RT-PCR (RSV, FLU A&B, COVID)  [id number]  POC URINE PREG, ED    EKG: None  Radiology: DG Chest 2 View Result Date: 05/22/2024 CLINICAL DATA:  Hematemesis, vomiting EXAM: CHEST - 2 VIEW COMPARISON:  None Available. FINDINGS: Heart and mediastinal contours are within normal limits. No focal opacities or effusions. No acute bony abnormality. IMPRESSION: No active cardiopulmonary disease. Electronically Signed   By: Franky Crease M.D.   On: 05/22/2024 18:20   CT ABDOMEN PELVIS W CONTRAST Result Date: 05/22/2024 CLINICAL DATA:  Abdominal/flank pain, stone suspected LLQ abdominal pain EXAM: CT ABDOMEN AND PELVIS WITH CONTRAST TECHNIQUE: Multidetector CT imaging of the abdomen and pelvis was performed using the standard protocol following bolus administration of intravenous contrast. RADIATION DOSE REDUCTION: This exam was performed according to the departmental dose-optimization program which includes automated exposure control, adjustment of the mA and/or kV according to patient size and/or use of iterative reconstruction technique.  CONTRAST:  75mL OMNIPAQUE  IOHEXOL  350 MG/ML SOLN COMPARISON:  None Available. FINDINGS: Lower chest: No acute findings Hepatobiliary: Diffuse low-density throughout the liver compatible with fatty infiltration. No focal abnormality. Gallbladder unremarkable. Pancreas: No focal abnormality or ductal dilatation. Spleen: No focal abnormality.  Normal size. Adrenals/Urinary Tract: No adrenal abnormality. No focal renal abnormality. No stones or hydronephrosis. Urinary bladder is unremarkable. Stomach/Bowel: Normal appendix. Stomach, large and small bowel grossly unremarkable. Vascular/Lymphatic: No evidence of aneurysm or adenopathy. Reproductive: Uterus and left adnexa unremarkable. Fat containing mass in the right ovary measures up to 3.3 cm most compatible with dermoid. Other: No free fluid or free air. Musculoskeletal: No acute bony abnormality. IMPRESSION: Hepatic steatosis. 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 No acute findings in the abdomen or pelvis. Electronically Signed   By: Franky Crease M.D.   On: 05/22/2024 18:16     Procedures   Medications Ordered in the ED  lactated ringers  bolus 1,000 mL (0 mLs Intravenous Stopped 05/22/24 2021)  metoCLOPramide  (REGLAN ) injection 10 mg (10 mg Intravenous Given 05/22/24 1741)  acetaminophen  (TYLENOL ) tablet 975 mg (975 mg Oral Given 05/22/24 1746)  iohexol   (OMNIPAQUE ) 350 MG/ML injection 75 mL (75 mLs Intravenous Contrast Given 05/22/24 1757)  ketorolac  (TORADOL ) 15 MG/ML injection 15 mg (15 mg Intravenous Given 05/22/24 1935)     Medical Decision Making Amount and/or Complexity of Data Reviewed Labs: ordered. Radiology: ordered.  Risk OTC drugs. Prescription drug management.   This patient is a 48 year old female who presents to the ED for concern of pain, nausea and vomiting that started acutely this a.m. accompanied by 6 episodes of vomiting after which she felt lightheaded particularly upon standing.   On physical exam, patient is in no acute distress, afebrile, alert and orient x 4, speaking in full sentences, nontachypneic.  Notably was mildly tachycardic with heart rate of low 100s.  Abdomen was mildly tender to the epigastric region however with negative Murphy sign, no CVA tenderness, no lower abdominal tenderness.  No lower leg swelling.  LCTAB.  The patient's current presentation, chest x-ray and CT scan were ordered and were unremarkable for any acute findings, did note a 3.3 cm dermoid cyst of right ovary.  Lab work was otherwise unremarkable.  Upon reevaluation after providing LR, Reglan , patient states that her symptoms have greatly improved, after providing Toradol , patient's headache has completely abated with no symptoms at this time, tachycardia also resolving.  Suspecting likely gastroenteritis as cause of patient's symptoms today as she has no lower experiencing any pain and is tolerating p.o. without any recurrence of symptoms.  Will have her continue follow-up with PCP for persistent symptoms return to the ED for any new or worsening symptoms.  Patient vital signs have remained stable throughout the course of patient's time in the ED. Low suspicion for any other emergent pathology at this time. I believe this patient is safe to be discharged. Provided strict return to ER precautions. Patient expressed agreement and  understanding of plan. All questions were answered.  Differential diagnoses prior to evaluation: The emergent differential diagnosis includes, but is not limited to, gastritis, pancreatitis, hepatitis, cholecystitis, splenic infarction, mesenteric ischemia, enteritis, diverticulitis, nephrolithiasis, pyelonephritis, foodborne illness, metabolic disturbance, upper GI bleed. This is not an exhaustive differential.   Past Medical History / Co-morbidities / Social History: No chronic past medical history  Additional history: Chart reviewed. Pertinent results include:   Last seen by PCP on 03/21/2024  Lab Tests/Imaging studies: I personally interpreted labs/imaging and the pertinent results include:  CBC unremarkable CMP notes a mildly elevated AST but otherwise unremarkable Lipase mildly elevated 57 UA shows rare bacteria and small leukocytes but otherwise unremarkable X-ray panel negative Chest x-ray unremarkable CT abdomen showed 3.3 dermoid cyst on right ovary but otherwise unremarkable for any acute findings.  I agree with the radiologist interpretation.  Cardiac monitoring: EKG obtained and interpreted by myself and attending physician which shows: Sinus tachycardia   Medications: I ordered medication including LR, Toradol , Reglan , Tylenol .  I have reviewed the patients home medicines and have made adjustments as needed.  Critical Interventions: None  Social Determinants of Health: Has good follow-up with PCP  Disposition: After consideration of the diagnostic results and the patients response to treatment, I feel that the patient would benefit from discharge and treatment as above.   emergency department workup does not suggest an emergent condition requiring admission or immediate intervention beyond what has been performed at this time. The plan is: Follow-up with PCP for persistent symptoms, Reglan  for control, return to the ED for new or worsening symptoms. The patient  is safe for discharge and has been instructed to return immediately for worsening symptoms, change in symptoms or any other concerns.  Final diagnoses:  Nausea and vomiting, unspecified vomiting type    ED Discharge Orders          Ordered    metoCLOPramide  (REGLAN ) 10 MG tablet  Every 6 hours        05/22/24 2057               Beola Terrall RAMAN, PA-C 05/22/24 2106    Pamella Ozell LABOR, DO 05/28/24 1955

## 2024-05-22 NOTE — ED Notes (Signed)
 Assuming pt care, pt walk in for abd pain x 2 hr prior to arrival, pt laying in bed, aaox4, ambulatory, reports headache, ER provider at bedside, orders to be added. Callbell within reach, family at bedside

## 2024-05-22 NOTE — Discharge Instructions (Addendum)
 You are seen today for nausea and vomiting.  Your vital signs and lab work and imaging were all very reassuring and a low suspicion for any emergent causes recent today.  I am placing an antinausea and headache medication for you to use as needed.  Additionally you can use Tylenol  for further headache relief.  Take Tylenol  (acetominophen)  650mg  every 4-6 hours, as needed for pain or fever. Do not take more than 4,000 mg in a 24-hour period. As this may cause liver damage. While this is rare, if you begin to develop yellowing of the skin or eyes, stop taking and return to ER immediately.  Please return to follow-up with PCP for reevaluation and to ensure that your symptoms are resolving.  Return to the ED though if you are having new or worsening symptoms.  Please sure to follow-up with your OB/GYN for the dermoid cyst noted on your ovary.  This is not an emergent problem at this time however will need further evaluation.

## 2024-05-22 NOTE — ED Triage Notes (Signed)
 Patient BIB [facility] from work for N/V, loose stools, no diarrhea, all starting this morning. Patient reports dizziness which has improved after 4mg  zofran  en route with EMS.  BP 125/75 HR 80 100% RA CBG 175 RR 18

## 2024-05-22 NOTE — ED Notes (Signed)
 Patient transported to CT

## 2024-05-22 NOTE — ED Provider Triage Note (Signed)
 Emergency Medicine Provider Triage Evaluation Note  Stacy Cole , a 48 y.o. female  was evaluated in triage.  Pt complains of vomiting that started this morning, occurring approximately every 15 minutes.  She states that she feels lightheaded and weak.  She was doing okay yesterday.  No diarrhea.  She does endorse epigastric pain.  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  Review of Systems  Positive: Epigastric pain, vomiting Negative: Diarrhea  Physical Exam  BP 127/85   Pulse 94   Temp 98.1 F (36.7 C) (Oral)   Resp 17   Ht 5' 6 (1.676 m)   Wt 97.5 kg   SpO2 99%   BMI 34.70 kg/m  Gen:   Awake, no distress   Resp:  Normal effort  MSK:   Moves extremities without difficulty  Other:    Medical Decision Making  Medically screening exam initiated at 12:12 PM.  Appropriate orders placed.  Stacy Cole was informed that the remainder of the evaluation will be completed by another provider, this initial triage assessment does not replace that evaluation, and the importance of remaining in the ED until their evaluation is complete.  Labs ordered.   Stacy Chew, PA-C 05/22/24 1213
